# Patient Record
Sex: Male | Born: 1990 | Race: White | Hispanic: No | Marital: Married | Smoking: Current every day smoker
Health system: Southern US, Community
[De-identification: ages and names within clinical notes are randomized; demographics above are authoritative.]

## PROBLEM LIST (undated history)

## (undated) DIAGNOSIS — J45909 Unspecified asthma, uncomplicated: Secondary | ICD-10-CM

## (undated) HISTORY — PX: APPENDECTOMY: SHX54

---
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
## 2014-10-09 ENCOUNTER — Emergency Department (HOSPITAL_COMMUNITY)
Admission: EM | Admit: 2014-10-09 | Discharge: 2014-10-09 | Disposition: A | Discharge status: Home / Self Care | Payer: Medicaid Other | Attending: Emergency Medicine | Admitting: Emergency Medicine

## 2014-10-09 ENCOUNTER — Encounter (HOSPITAL_COMMUNITY): Payer: Self-pay | Admitting: *Deleted

## 2014-10-09 ENCOUNTER — Emergency Department (HOSPITAL_COMMUNITY): Payer: Medicaid Other

## 2014-10-09 DIAGNOSIS — F419 Anxiety disorder, unspecified: Secondary | ICD-10-CM | POA: Diagnosis not present

## 2014-10-09 DIAGNOSIS — Z7982 Long term (current) use of aspirin: Secondary | ICD-10-CM | POA: Diagnosis not present

## 2014-10-09 DIAGNOSIS — R197 Diarrhea, unspecified: Secondary | ICD-10-CM | POA: Diagnosis not present

## 2014-10-09 DIAGNOSIS — R079 Chest pain, unspecified: Secondary | ICD-10-CM | POA: Insufficient documentation

## 2014-10-09 DIAGNOSIS — J45901 Unspecified asthma with (acute) exacerbation: Secondary | ICD-10-CM | POA: Insufficient documentation

## 2014-10-09 DIAGNOSIS — Z720 Tobacco use: Secondary | ICD-10-CM | POA: Insufficient documentation

## 2014-10-09 HISTORY — DX: Unspecified asthma, uncomplicated: J45.909

## 2014-10-09 LAB — I-STAT TROPONIN, ED
TROPONIN I, POC: 0 ng/mL (ref 0.00–0.08)
Troponin i, poc: 0 ng/mL (ref 0.00–0.08)

## 2014-10-09 LAB — CBC
HCT: 39.6 % (ref 39.0–52.0)
HEMOGLOBIN: 13.7 g/dL (ref 13.0–17.0)
MCH: 30.6 pg (ref 26.0–34.0)
MCHC: 34.6 g/dL (ref 30.0–36.0)
MCV: 88.4 fL (ref 78.0–100.0)
PLATELETS: 266 10*3/uL (ref 150–400)
RBC: 4.48 MIL/uL (ref 4.22–5.81)
RDW: 12.6 % (ref 11.5–15.5)
WBC: 5 10*3/uL (ref 4.0–10.5)

## 2014-10-09 LAB — BASIC METABOLIC PANEL
Anion gap: 12 (ref 5–15)
BUN: 17 mg/dL (ref 6–20)
CALCIUM: 9.6 mg/dL (ref 8.9–10.3)
CHLORIDE: 103 mmol/L (ref 101–111)
CO2: 21 mmol/L — AB (ref 22–32)
Creatinine, Ser: 0.88 mg/dL (ref 0.61–1.24)
GFR calc non Af Amer: >60 mL/min (ref >60)
Glucose, Bld: 91 mg/dL (ref 65–99)
Potassium: 3.9 mmol/L (ref 3.5–5.1)
Sodium: 136 mmol/L (ref 135–145)

## 2014-10-09 MED ORDER — ALBUTEROL SULFATE (2.5 MG/3ML) 0.083% IN NEBU
2.5000 mg | INHALATION_SOLUTION | Freq: Once | RESPIRATORY_TRACT | Status: AC
  Administered 2014-10-09: 2.5 mg via RESPIRATORY_TRACT
  Filled 2014-10-09: qty 3

## 2014-10-09 MED ORDER — ALBUTEROL SULFATE HFA 108 (90 BASE) MCG/ACT IN AERS
2.0000 | INHALATION_SPRAY | Freq: Once | RESPIRATORY_TRACT | Status: AC
  Administered 2014-10-09: 2 via RESPIRATORY_TRACT
  Filled 2014-10-09: qty 6.7

## 2014-10-09 MED ORDER — ASPIRIN 81 MG PO CHEW
324.0000 mg | CHEWABLE_TABLET | Freq: Once | ORAL | Status: AC
  Administered 2014-10-09: 324 mg via ORAL
  Filled 2014-10-09: qty 4

## 2014-10-09 NOTE — ED Notes (Signed)
Pt reports mid chest heaviness this am with mild sob. Reports recent high stress levels and weight loss, no appetite. ekg done. Airway intact.

## 2014-10-09 NOTE — ED Provider Notes (Signed)
CSN: [id number]     Arrival date & time 10/09/14  4098 History  This chart was scribed for Catha Gosselin, PA-C, working with Margarita Grizzle, MD by Chestine Spore, ED Scribe. The patient was seen in room B18C/B18C at 9:0o AM.     Chief Complaint  Patient presents with  . Chest Pain      The history is provided by the patient. No language interpreter was used.    HPI Comments: Derrick Collins is a 24 y.o. male with a medical hx of asthma who presents to the Emergency Department complaining of non-radiating left sided CP onset 7:30 AM PTA. Pt reports that his CP didn't wake him up from his sleep. Pt rates his CP as 8/10 currently. Pt does do marijuana to aid in increasing his appetite. 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 Pt notes that he has lost 18 lbs in the past month and 5 lbs in the past 3 days due to lack of appetite and he initially weighed 135 lbs. He states that he is having associated symptoms of mild SOB, weight loss, appetite change, chills, loose stool x 2 days without blood. Pt notes that he will intermittently have palpitations in the past. He denies n/v, blood in stool, abdominal pain, dysuria, hematuria, and any other symptoms. Pt does smoke but he smokes less than 0.5 PPD. He denies any alcohol use.    Past Medical History  Diagnosis Date  . Asthma    History reviewed. No pertinent past surgical history. History reviewed. No pertinent family history. Social History  Substance Use Topics  . Smoking status: Current Every Day Smoker    Types: Cigarettes  . Smokeless tobacco: None  . Alcohol Use: No    Review of Systems  Constitutional: Positive for appetite change and unexpected weight change.  Respiratory: Positive for shortness of breath (mild).   Cardiovascular: Positive for chest pain.  Gastrointestinal: Positive for diarrhea (loose stool). Negative for nausea, vomiting, abdominal pain and blood in stool.  Genitourinary: Negative for dysuria and hematuria.   All other systems reviewed and are negative.     Allergies  Sulfa antibiotics  Home Medications   Prior to Admission medications   Medication Sig Start Date End Date Taking? Authorizing Provider  aspirin 81 MG chewable tablet Chew 81 mg by mouth daily as needed for mild pain or moderate pain.   Yes Historical Provider, MD   BP 119/76 mmHg  Pulse 81  Temp(Src) 98.2 F (36.8 C) (Oral)  Resp 13  Ht 5\' 11"  (1.803 m)  Wt 117 lb 14.4 oz (53.479 kg)  BMI 16.45 kg/m2  SpO2 99% Physical Exam  Constitutional: He is oriented to person, place, and time. He appears well-developed and well-nourished. No distress.  HENT:  Head: Normocephalic and atraumatic.  Eyes: EOM are normal.  Neck: Neck supple. No tracheal deviation present.  Cardiovascular: Normal rate, regular rhythm and normal heart sounds.  Exam reveals no gallop and no friction rub.   No murmur heard. Pulmonary/Chest: Effort normal and breath sounds normal. No accessory muscle usage. No respiratory distress. He has no decreased breath sounds. He has no wheezes. He has no rales. He exhibits no tenderness.  Abdominal: Soft. He exhibits no distension. There is no tenderness. There is no rebound and no guarding.  Musculoskeletal: Normal range of motion.  Neurological: He is alert and oriented to person, place, and time.  Skin: Skin is warm and dry.  Psychiatric: His behavior is normal. His  mood appears anxious.  Nursing note and vitals reviewed.   ED Course  Procedures (including critical care time) DIAGNOSTIC STUDIES: Oxygen Saturation is 99% on RA, nl by my interpretation.    COORDINATION OF CARE: 9:16 AM Discussed treatment plan with pt at bedside and pt agreed to plan.   Labs Review Labs Reviewed  BASIC METABOLIC PANEL - Abnormal; Notable for the following:    CO2 21 (*)    All other components within normal limits  CBC  I-STAT TROPOININ, ED  Rosezena Sensor, ED    Imaging Review Dg Chest 2 View  10/09/2014    CLINICAL DATA:  24 year old male with left-sided chest pain, shortness breath and weakness this morning.  EXAM: CHEST  2 VIEW  COMPARISON:  No priors.  FINDINGS: Lung volumes are normal. No consolidative airspace disease. No pleural effusions. No pneumothorax. No pulmonary nodule or mass noted. Pulmonary vasculature and the cardiomediastinal silhouette are within normal limits.  IMPRESSION: No radiographic evidence of acute cardiopulmonary disease.   Electronically Signed   By: Trudie Reed M.D.   On: 10/09/2014 09:17   I, Patel-Mills, Lorelle Formosa, personally reviewed and evaluated these images and lab results as part of my medical decision-making.    EKG Interpretation   Date/Time:  Sunday October 09 2014 08:25:47 EDT Ventricular Rate:  99 PR Interval:  144 QRS Duration: 100 QT Interval:  358 QTC Calculation: 459 R Axis:   98 Text Interpretation:  Normal sinus rhythm Rightward axis No previous  tracing Confirmed by Denton Lank  MD, Caryn Bee ([id number]) on 10/09/2014 9:25:04 AM      MDM   Final diagnoses:  Chest pain, unspecified chest pain type  Patient presents for chest pain with shortness of breath and chest pain. He was given aspirin as well as a neb treatment. He is well-appearing and in no acute distress. Vitals are stable and have been throughout his stay. Troponin is negative. Labs unremarkable. EKG normal. Chest x-ray shows no pneumothorax, edema, pneumonia. Recheck: Patient states his chest pain has come down. Repeat troponin negative. I discussed return precautions with the patient. Patient was given albuterol inhaler in the ED before discharge. Medications  aspirin chewable tablet 324 mg (324 mg Oral Given 10/09/14 0927)  albuterol (PROVENTIL) (2.5 MG/3ML) 0.083% nebulizer solution 2.5 mg (2.5 mg Nebulization Given 10/09/14 1015)  albuterol (PROVENTIL HFA;VENTOLIN HFA) 108 (90 BASE) MCG/ACT inhaler 2 puff (2 puffs Inhalation Given 10/09/14 1319)  I personally performed the services described  in this documentation, which was scribed in my presence. The recorded information has been reviewed and is accurate.   Catha Gosselin, PA-C 10/09/14 1444  Margarita Grizzle, MD 10/09/14 1623

## 2014-10-09 NOTE — ED Notes (Signed)
Tried to track down PA. Unable to find at this time.

## 2014-10-09 NOTE — Discharge Instructions (Signed)
Chest Pain (Nonspecific) °It is often hard to give a diagnosis for the cause of chest pain. There is always a chance that your pain could be related to something serious, such as a heart attack or a blood clot in the lungs. You need to follow up with your doctor. °HOME CARE °· If antibiotic medicine was given, take it as directed by your doctor. Finish the medicine even if you start to feel better. °· For the next few days, avoid activities that bring on chest pain. Continue physical activities as told by your doctor. °· Do not use any tobacco products. This includes cigarettes, chewing tobacco, and e-cigarettes. °· Avoid drinking alcohol. °· Only take medicine as told by your doctor. °· Follow your doctor's suggestions for more testing if your chest pain does not go away. °· Keep all doctor visits you made. °GET HELP IF: °· Your chest pain does not go away, even after treatment. °· You have a rash with blisters on your chest. °· You have a fever. °GET HELP RIGHT AWAY IF:  °· You have more pain or pain that spreads to your arm, neck, jaw, back, or belly (abdomen). °· You have shortness of breath. °· You cough more than usual or cough up blood. °· You have very bad back or belly pain. °· You feel sick to your stomach (nauseous) or throw up (vomit). °· You have very bad weakness. °· You pass out (faint). °· You have chills. °This is an emergency. Do not wait to see if the problems will go away. Call your local emergency services (911 in [place] Do not drive yourself to the hospital. °MAKE SURE YOU:  °· Understand these instructions. °· Will watch your condition. °· Will get help right away if you are not doing well or get worse. °Document Released: 07/31/2007 Document Revised: 02/16/2013 Document Reviewed: 07/31/2007 °ExitCare® Patient Information ©2015 [employer]. This information is not intended to replace advice given to you by your health care provider. Make sure you discuss any questions you have with your  health care provider. ° ° °Emergency Department Resource Guide °1) Find a Doctor and Pay Out of Pocket °Although you won't have to find out who is covered by your insurance plan, it is a good idea to ask around and get recommendations. You will then need to call the office and see if the doctor you have chosen will accept you as a new patient and what types of options they offer for patients who are self-pay. Some doctors offer discounts or will set up payment plans for their patients who do not have insurance, but you will need to ask so you aren't surprised when you get to your appointment. ° °2) Contact Your Local Health Department °Not all health departments have doctors that can see patients for sick visits, but many do, so it is worth a call to see if yours does. If you don't know where your local health department is, you can check in your phone book. The CDC also has a tool to help you locate your state's health department, and many state websites also have listings of all of their local health departments. ° °3) Find a Walk-in Clinic °If your illness is not likely to be very severe or complicated, you may want to try a walk in clinic. These are popping up all over the country in pharmacies, drugstores, and shopping centers. They're usually staffed by nurse practitioners or physician assistants that have been trained to treat common   illnesses and complaints. They're usually fairly quick and inexpensive. However, if you have serious medical issues or chronic medical problems, these are probably not your best option. ° °No Primary Care Doctor: °- Call [facility] at  832-8000 - they can help you locate a primary care doctor that  accepts your insurance, provides certain services, etc. °- Physician Referral Service- 1-800-533-3463 ° °Chronic Pain Problems: °Organization         Address  Phone   Notes  °[facility]  (336) 297-2271 Patients need to be referred by their primary care doctor.   ° °Medication Assistance: °Organization         Address  Phone   Notes  °[place] [facility] [address] °[place], [place] [address] (336) 641-8030 --Must be a resident of [place] °-- Must have NO insurance coverage whatsoever (no Medicaid/ Medicare, etc.) °-- The pt. MUST have a primary care doctor that directs their care regularly and follows them in the community °  °[facility]  (866) 331-1348   °[employer]  (888) 892-1162   ° °Agencies that provide inexpensive medical care: °Organization         Address  Phone   Notes  °[facility]  (336) 832-8035   °[facility]    (336) 832-7272   °[facility] [address] °[place], [place] [address] (336) 832-4777   °[facility] [address], °[place] (336) 271-4999   °[facility]    (336) 373-0678   °[facility]    (336) 272-1050   °[facility] ° [address], [place] Phone:  (336) 832-4444, Fax:  (336) 832-4440 Hours of Operation:  9 am - 6 pm, M-F.  Also accepts Medicaid/Medicare and self-pay.  °[facility] ° [address], [place] Phone: (336) 832-3150, Fax: (336) 832-3151. Hours of Operation:  8:30 am - 5:30 pm, M-F.  Also accepts Medicaid and self-pay.  °[facility] [place] [address], [place] Phone: (336) 878-6027   °[facility] [address], [place], [place] (336)723-1848, Ext. 123 Mondays & Thursdays: 7-9 AM.  First 15 patients are seen on a first come, first serve basis. °  ° °Medicaid-accepting [place] Providers: ° °Organization         Address  Phone   Notes  °[facility] [address], [place] (336) 641-2100 Also accepts self-pay patients.  °[facility] [address], [place] ° (336) 856-9996   °[facility] [address], [place] (336) 288-8857   °[facility] [address] [place] [address], [place] (336) 299-7000   °Veita Bland [address], [place]  ° (336) 373-1557 Only accepts [place] Access Medicaid patients after they have their name applied to their card.  ° °Self-Pay (no insurance) in [place]: ° °Organization         Address  Phone   Notes  °Sickle Cell Patients, [facility] [address], [place] (336) 832-1970   °[facility] [address], [place] (336) 832-4400   °[facility] ° [address], [place] (336) 992-4800   °[facility]/Dr. Osei-Bonsu ° [address] [place] [address],  or [address],   [place] (336) 841-8500 Phone number for both [place] and [place] locations is the same.  °[facility] [address], [place] (336) 299-0000   °[facility] [address] [place] [address], [place] or [address] (336) 852-7530 °(336) 878-2260   °[facility] [address], [place] (336) 350-1642, phone; (336) 294-5005, fax Sees patients 1st and 3rd Saturday of every month.  Must not qualify for public or private insurance (i.e. Medicaid, Medicare, [place] Health Choice, Veterans' Benefits) • Household income should be no more than 200% of the poverty level •The clinic cannot treat you if you are pregnant or think you are pregnant • Sexually transmitted diseases are not treated at the clinic.  ° ° °Dental Care: °Organization         Address  Phone  Notes  °[facility] [address], [place] (336) 641-6152 Accepts children up to age 21 who are enrolled in Medicaid or [place] Health Choice; pregnant women with a Medicaid card; and children who have applied for Medicaid or [place] Health Choice, but were declined, whose parents can pay a reduced fee at time of service.    °[facility] [place]  [address], [place] (336) 641-7733 Accepts children up to age 21 who are enrolled in Medicaid or [place] Health Choice; pregnant women with a Medicaid card; and children who have applied for Medicaid or [place] Health Choice, but were declined, whose parents can pay a reduced fee at time of service.  °[facility] ° [address], [place] (336) 641-4533 Patients are seen by appointment only. Walk-ins are not accepted. [facility] will see patients 18 years of age and older. °Monday - Tuesday (8am-5pm) °Most Wednesdays (8:30-5pm) °$30 per visit, cash only  °[facility] ° [address], [place] (336) 641-4533 Patients are seen by appointment only. Walk-ins are not accepted. [facility] will see patients 18 years of age and older. °One Wednesday Evening (Monthly: Volunteer Based).  $30 per visit, cash only  °[facility]  (919) 537-3737 for adults; Children under age 4, call Graduate Pediatric Dentistry at (919) [phone]. Children aged 4-14, please call (919) [phone] to request a pediatric application. ° Dental services are provided in all areas of dental care including fillings, crowns and bridges, complete and partial dentures, implants, gum treatment, root canals, and extractions. Preventive care is also provided. Treatment is provided to both adults and children. °Patients are selected via a lottery and there is often a waiting list. °  °[facility] [address], ° ° (336) 763-8833 [email or web address] °  °[facility] [address], [place], [place] (336)723-1848, Ext. 123 Second and Fourth Thursday of each month, opens at 6:30 AM; Clinic ends at 9 AM.  Patients are seen on a first-come first-served basis, and a limited number are seen during each clinic.  ° °[facility] ° [address], [place], [place] (336)  723-7904   Eligibility Requirements °You must have lived in [place], [place], or [place] counties for at least the last three months. °  You cannot be eligible for state or federal sponsored healthcare insurance, including Veterans Administration, Medicaid, or Medicare. °  You generally cannot be eligible for healthcare insurance through your employer.  °  How to apply: °Eligibility screenings are held every Tuesday   and Wednesday afternoon from 1:00 pm until 4:00 pm. You do not need an appointment for the interview!  °[facility] [address], [place], [place] 336-631-2330   °[facility]  336-342-8273   °[facility]  336-703-3100   °[facility]  336-570-6415   ° °Behavioral Health Resources in the Community: °Intensive Outpatient Programs °Organization         Address  Phone  Notes  °[facility] [address], [place], [place] 336-878-6098   °[facility] [address], [place], [place] 336-832-9800   °[facility] [address], [place], [place] ° 336-882-2125   °[facility] [address],  °[place], [place] 1-800-853-5163 or 336-641-4981   °Substance Abuse Resources °Organization         Address  Phone  Notes  °[facility]  336-882-2125   °[facility]  336-784-9470   °[facility]  336-285-9073   °[facility]  336-845-3988   °Residential & Outpatient Substance Abuse Program  1-800-659-3381   °Psychological Services °Organization         Address  Phone  Notes  °[facility]  336- 832-9600   °[facility]  336- 378-7881   °[facility] [address], [place] 1-800-853-5163 or 336-641-4981   ° °Mobile Crisis Teams °Organization         Address  Phone  Notes  °[facility], Mobile Crisis Care Unit  1-877-626-1772   °Assertive °[facility] ° [address] [place], [place] 336-834-9664   °Sharon DeEsch [address] °[place] 336-554-5454   ° °Self-Help/Support Groups °Organization         Address  Phone             Notes  °[facility] - variety of support groups  336- 373-1402 Call for more information  °Narcotics Anonymous (NA), [facility] [address], °[place]  2 meetings at this location  ° °Residential Treatment Programs °Organization         Address  Phone  Notes  °[facility] [address],    °[place]  1-866-801-8205   °[facility] ° [address], [place], [place] 704-293-8524   °[facility] [address], [place] 336-845-3988 Admissions: 8am-3pm M-F  °[facility] [address]    °[place], [place] 336-841-1104   °[facility] [address], [place], [place] 336-379-7146   °[facility] [address]  °[place], [place] 336-285-9073   °[facility] - Intensive Outpatient [address], [place], [place] 336-852-3033   °[facility] [address]  °[place], [place] 1-877-615-2722 or 336-784-9470   °[facility] [address] [place], [place] 336-227-7417 Accepts Medicaid  °[facility] [address]  °[place] 1-800-659-3381 Substance Abuse/Addiction Treatment  ° °[place] Behavioral Health Resources °Organization         Address  Phone  Notes  °[facility]  (888) 581-9988   °Julie Brannon, PhD [address] [place], [place]   (336) 349-5553 or (336) 951-0000   °[facility]   [address] °[place], [place] (336) 349-4454   °[facility] [address], [place],  (336) 342-8316 Insurance/Medicaid/sponsorship through [facility]  °[facility] [address]                                    [place], [place] (336) 342-8316  Therapy/tele-psych/case  °[facility] [address]  ° [place], [place] (336) 349-2233    °Dr. Arfeen  (336) 349-4544   °[facility] [place]  [employer] [place] [facility] 1) [address], [place] °2) [address], [place] °3)  [address], [place] (336) 349-3220 °(336) 342-7768 ° °(336) 342-8140   °[place] [facility] (336) 342-1394 or (336) 342-3537 (After Hours)    ° ° ° °

## 2014-12-12 ENCOUNTER — Encounter (HOSPITAL_COMMUNITY): Payer: Self-pay | Admitting: Oncology

## 2014-12-12 ENCOUNTER — Emergency Department (HOSPITAL_COMMUNITY)
Admission: EM | Admit: 2014-12-12 | Discharge: 2014-12-12 | Disposition: A | Discharge status: Home / Self Care | Payer: Medicaid Other | Attending: Emergency Medicine | Admitting: Emergency Medicine

## 2014-12-12 DIAGNOSIS — K029 Dental caries, unspecified: Secondary | ICD-10-CM | POA: Diagnosis not present

## 2014-12-12 DIAGNOSIS — Z7982 Long term (current) use of aspirin: Secondary | ICD-10-CM | POA: Diagnosis not present

## 2014-12-12 DIAGNOSIS — Z720 Tobacco use: Secondary | ICD-10-CM | POA: Insufficient documentation

## 2014-12-12 DIAGNOSIS — J45909 Unspecified asthma, uncomplicated: Secondary | ICD-10-CM | POA: Insufficient documentation

## 2014-12-12 DIAGNOSIS — K0889 Other specified disorders of teeth and supporting structures: Secondary | ICD-10-CM | POA: Diagnosis present

## 2014-12-12 MED ORDER — NAPROXEN 375 MG PO TABS: 375.0000 mg | ORAL_TABLET | Freq: Two times a day (BID) | ORAL | Status: DC

## 2014-12-12 MED ORDER — PENICILLIN V POTASSIUM 500 MG PO TABS
500.0000 mg | ORAL_TABLET | Freq: Once | ORAL | Status: AC
  Administered 2014-12-12: 500 mg via ORAL
  Filled 2014-12-12: qty 1

## 2014-12-12 MED ORDER — NAPROXEN 375 MG PO TABS
375.0000 mg | ORAL_TABLET | Freq: Once | ORAL | Status: AC
Start: 2014-12-12 — End: 2014-12-12
  Administered 2014-12-12: 375 mg via ORAL
  Filled 2014-12-12: qty 1

## 2014-12-12 MED ORDER — PENICILLIN V POTASSIUM 500 MG PO TABS: 500.0000 mg | ORAL_TABLET | Freq: Four times a day (QID) | ORAL | Status: AC

## 2014-12-12 NOTE — ED Provider Notes (Signed)
CSN: [id number]     Arrival date & time 12/12/14  0242 History   First MD Initiated Contact with Patient 12/12/14 0354     Chief Complaint  Patient presents with  . Dental Pain     (Consider location/radiation/quality/duration/timing/severity/associated sxs/prior Treatment) Patient is a 24 y.o. male presenting with tooth pain. The history is provided by the patient.  Dental Pain Location:  Upper Upper teeth location:  3/RU 1st molar Quality:  Dull Severity:  Severe Onset quality:  Gradual Timing:  Constant Progression:  Unchanged Chronicity:  New Context: dental caries   Previous work-up:  Dental exam Relieved by:  Nothing Worsened by:  Nothing tried Ineffective treatments:  None tried Associated symptoms: no congestion, no drooling, no facial swelling and no trismus   Risk factors: no diabetes     Past Medical History  Diagnosis Date  . Asthma    History reviewed. No pertinent past surgical history. History reviewed. No pertinent family history. Social History  Substance Use Topics  . Smoking status: Current Every Day Smoker    Types: Cigarettes  . Smokeless tobacco: Never Used  . Alcohol Use: No    Review of Systems  HENT: Positive for dental problem. Negative for congestion, drooling and facial swelling.   All other systems reviewed and are negative.     Allergies  Sulfa antibiotics  Home Medications   Prior to Admission medications   Medication Sig Start Date End Date Taking? Authorizing Provider  aspirin 81 MG chewable tablet Chew 81 mg by mouth daily as needed for mild pain or moderate pain.    Historical Provider, MD   BP 138/65 mmHg  Pulse 77  Temp(Src) 97.9 F (36.6 C) (Oral)  Resp 20  Ht 5\' 11"  (1.803 m)  Wt 125 lb (56.7 kg)  BMI 17.44 kg/m2  SpO2 99% Physical Exam  Constitutional: He is oriented to person, place, and time. He appears well-developed and well-nourished. No distress.  HENT:  Head: Normocephalic and atraumatic.    Mouth/Throat: Oropharynx is clear and moist. No trismus in the jaw.    Eyes: Conjunctivae and EOM are normal. Pupils are equal, round, and reactive to light.  Neck: Normal range of motion. Neck supple.  Cardiovascular: Normal rate and regular rhythm.   Pulmonary/Chest: Effort normal and breath sounds normal. No stridor. He has no wheezes. He has no rales.  Abdominal: Soft. Bowel sounds are normal. There is no tenderness.  Musculoskeletal: Normal range of motion.  Lymphadenopathy:    He has no cervical adenopathy.  Neurological: He is alert and oriented to person, place, and time.  Skin: Skin is warm and dry.  Psychiatric: He has a normal mood and affect.    ED Course  Procedures (including critical care time) Labs Review Labs Reviewed - No data to display  Imaging Review No results found. I have personally reviewed and evaluated these images and lab results as part of my medical decision-making.   EKG Interpretation None      MDM   Final diagnoses:  None    Penicillin, Naproxen and referral to dentistry for ongoing care.      Cy BlamerApril Taline Nass, MD 12/12/14 [phone]

## 2014-12-12 NOTE — Discharge Instructions (Signed)
Dental Care and Dentist Visits °Dental care supports good overall health. Regular dental visits can also help you avoid dental pain, bleeding, infection, and other more serious health problems in the future. It is important to keep the mouth healthy because diseases in the teeth, gums, and other oral tissues can spread to other areas of the body. Some problems, such as diabetes, heart disease, and pre-term labor have been associated with poor oral health.  °See your dentist every 6 months. If you experience emergency problems such as a toothache or broken tooth, go to the dentist right away. If you see your dentist regularly, you may catch problems early. It is easier to be treated for problems in the early stages.  °WHAT TO EXPECT AT A DENTIST VISIT  °Your dentist will look for many common oral health problems and recommend proper treatment. At your regular dental visit, you can expect: °· Gentle cleaning of the teeth and gums. This includes scraping and polishing. This helps to remove the sticky substance around the teeth and gums (plaque). Plaque forms in the mouth shortly after eating. Over time, plaque hardens on the teeth as tartar. If tartar is not removed regularly, it can cause problems. Cleaning also helps remove stains. °· Periodic X-rays. These pictures of the teeth and supporting bone will help your dentist assess the health of your teeth. °· Periodic fluoride treatments. Fluoride is a natural mineral shown to help strengthen teeth. Fluoride treatment involves applying a fluoride gel or varnish to the teeth. It is most commonly done in children. °· Examination of the mouth, tongue, jaws, teeth, and gums to look for any oral health problems, such as: °¨ Cavities (dental caries). This is decay on the tooth caused by plaque, sugar, and acid in the mouth. It is best to catch a cavity when it is small. °¨ Inflammation of the gums caused by plaque buildup (gingivitis). °¨ Problems with the mouth or malformed  or misaligned teeth. °¨ Oral cancer or other diseases of the soft tissues or jaws.  °KEEP YOUR TEETH AND GUMS HEALTHY °For healthy teeth and gums, follow these general guidelines as well as your dentist's specific advice: °· Have your teeth professionally cleaned at the dentist every 6 months. °· Brush twice daily with a fluoride toothpaste. °· Floss your teeth daily.  °· Ask your dentist if you need fluoride supplements, treatments, or fluoride toothpaste. °· Eat a healthy diet. Reduce foods and drinks with added sugar. °· Avoid smoking. °TREATMENT FOR ORAL HEALTH PROBLEMS °If you have oral health problems, treatment varies depending on the conditions present in your teeth and gums. °· Your caregiver will most likely recommend good oral hygiene at each visit. °· For cavities, gingivitis, or other oral health disease, your caregiver will perform a procedure to treat the problem. This is typically done at a separate appointment. Sometimes your caregiver will refer you to another dental specialist for specific tooth problems or for surgery. °SEEK IMMEDIATE DENTAL CARE IF: °· You have pain, bleeding, or soreness in the gum, tooth, jaw, or mouth area. °· A permanent tooth becomes loose or separated from the gum socket. °· You experience a blow or injury to the mouth or jaw area. °  °This information is not intended to replace advice given to you by your health care provider. Make sure you discuss any questions you have with your health care provider. °  °Document Released: 10/24/2010 Document Revised: 05/06/2011 Document Reviewed: 10/24/2010 °Elsevier Interactive Patient Education ©2016 [employer] ° °

## 2014-12-12 NOTE — ED Notes (Signed)
Pt presents d/t right upper dental pain x 1 week.  Pt has taken OTC pain medication w/o relief.  Pt states that he cannot get into the dentist for 2 more weeks.

## 2016-04-08 ENCOUNTER — Ambulatory Visit (HOSPITAL_COMMUNITY)
Admission: EM | Admit: 2016-04-08 | Discharge: 2016-04-08 | Disposition: A | Discharge status: Home / Self Care | Payer: Medicaid Other | Attending: Family Medicine | Admitting: Family Medicine

## 2016-04-08 ENCOUNTER — Encounter (HOSPITAL_COMMUNITY): Payer: Self-pay | Admitting: Emergency Medicine

## 2016-04-08 DIAGNOSIS — R112 Nausea with vomiting, unspecified: Secondary | ICD-10-CM

## 2016-04-08 DIAGNOSIS — R197 Diarrhea, unspecified: Secondary | ICD-10-CM

## 2016-04-08 MED ORDER — ONDANSETRON 4 MG PO TBDP
ORAL_TABLET | ORAL | Status: AC
  Filled 2016-04-08: qty 1

## 2016-04-08 MED ORDER — ONDANSETRON HCL 4 MG PO TABS: 4.0000 mg | ORAL_TABLET | Freq: Four times a day (QID) | ORAL | 0 refills | Status: DC

## 2016-04-08 MED ORDER — SODIUM CHLORIDE 0.9 % IV BOLUS (SEPSIS)
1000.0000 mL | Freq: Once | INTRAVENOUS | Status: AC
  Administered 2016-04-08: 1000 mL via INTRAVENOUS

## 2016-04-08 MED ORDER — ONDANSETRON 4 MG PO TBDP
4.0000 mg | ORAL_TABLET | Freq: Once | ORAL | Status: AC
  Administered 2016-04-08: 4 mg via ORAL

## 2016-04-08 NOTE — Discharge Instructions (Signed)
Clear liquid , bland diet tonight as tolerated, advance on fri as improved, use medicine as needed, return or see your doctor if any problems.  °

## 2016-04-08 NOTE — ED Triage Notes (Signed)
[facility] a complaint of abdominal cramping with N/V/D and a sore throat that started yesterday. The patient denied any fever at home.

## 2016-04-08 NOTE — ED Provider Notes (Signed)
[facility]    CSN: [id number] Arrival date & time: 04/08/16  1353     History   Chief Complaint Chief Complaint  Patient presents with  . Abdominal Cramping    HPI Derrick Collins is a 26 y.o. male.   The history is provided by the patient.  Abdominal Cramping  This is a new problem. The current episode started yesterday (wife similarly sick but resolviing). The problem has been gradually improving. Associated symptoms include abdominal pain. The symptoms are aggravated by eating.    Past Medical History:  Diagnosis Date  . Asthma     There are no active problems to display for this patient.   History reviewed. No pertinent surgical history.     Home Medications    Prior to Admission medications   Medication Sig Start Date End Date Taking? Authorizing Provider  ondansetron (ZOFRAN) 4 MG tablet Take 1 tablet (4 mg total) by mouth every 6 (six) hours. Prn n/v. 04/08/16   Linna HoffJames D Rudolf Blizard, MD    Family History History reviewed. No pertinent family history.  Social History Social History  Substance Use Topics  . Smoking status: Current Every Day Smoker    Types: Cigarettes  . Smokeless tobacco: Never Used  . Alcohol use No     Allergies   Sulfa antibiotics   Review of Systems Review of Systems  Constitutional: Positive for activity change, appetite change and fever.  HENT: Negative.   Respiratory: Negative.   Cardiovascular: Negative.   Gastrointestinal: Positive for abdominal pain, diarrhea, nausea and vomiting. Negative for blood in stool and constipation.  Genitourinary: Negative.   Musculoskeletal: Negative.      Physical Exam Triage Vital Signs ED Triage Vitals [04/08/16 1554]  Enc Vitals Group     BP 105/66     Pulse Rate 81     Resp 18     Temp 98.2 F (36.8 C)     Temp Source Oral     SpO2 98 %     Weight      Height      Head Circumference      Peak Flow      Pain Score 9     Pain Loc      Pain Edu?      Excl. in  GC?    No data found.   Updated Vital Signs BP 105/66 (BP Location: Right Arm)   Pulse 81   Temp 98.2 F (36.8 C) (Oral)   Resp 18   SpO2 98%   Visual Acuity Right Eye Distance:   Left Eye Distance:   Bilateral Distance:    Right Eye Near:   Left Eye Near:    Bilateral Near:     Physical Exam  Constitutional: He is oriented to person, place, and time. He appears distressed.  Neck: Normal range of motion. Neck supple.  Pulmonary/Chest: Effort normal and breath sounds normal.  Abdominal: Soft. Bowel sounds are normal. He exhibits no distension and no mass. There is tenderness. There is no rebound and no guarding. No hernia.  Neurological: He is alert and oriented to person, place, and time.  Skin: Skin is warm and dry.  Nursing note and vitals reviewed.    UC Treatments / Results  Labs (all labs ordered are listed, but only abnormal results are displayed) Labs Reviewed - No data to display  EKG  EKG Interpretation None       Radiology No results found.  Procedures Procedures (  including critical care time)  Medications Ordered in UC Medications  sodium chloride 0.9 % bolus 1,000 mL (1,000 mLs Intravenous Given 04/08/16 1741)  ondansetron (ZOFRAN-ODT) disintegrating tablet 4 mg (4 mg Oral Given 04/08/16 1741)     Initial Impression / Assessment and Plan / UC Course  I have reviewed the triage vital signs and the nursing notes.  Pertinent labs & imaging results that were available during my care of the patient were reviewed by me and considered in my medical decision making (see chart for details).    Sx improved after ivf.   Final Clinical Impressions(s) / UC Diagnoses   Final diagnoses:  Nausea vomiting and diarrhea    New Prescriptions New Prescriptions   ONDANSETRON (ZOFRAN) 4 MG TABLET    Take 1 tablet (4 mg total) by mouth every 6 (six) hours. Prn Ewell Poe, MD 04/08/16 [phone]

## 2016-06-25 ENCOUNTER — Ambulatory Visit (HOSPITAL_COMMUNITY)
Admission: EM | Admit: 2016-06-25 | Discharge: 2016-06-25 | Disposition: A | Discharge status: Home / Self Care | Payer: Self-pay | Attending: Family Medicine | Admitting: Family Medicine

## 2016-06-25 ENCOUNTER — Encounter (HOSPITAL_COMMUNITY): Payer: Self-pay | Admitting: Family Medicine

## 2016-06-25 DIAGNOSIS — G44209 Tension-type headache, unspecified, not intractable: Secondary | ICD-10-CM

## 2016-06-25 MED ORDER — CYCLOBENZAPRINE HCL 10 MG PO TABS: 10.0000 mg | ORAL_TABLET | Freq: Two times a day (BID) | ORAL | 0 refills | Status: AC | PRN

## 2016-06-25 MED ORDER — NAPROXEN 500 MG PO TABS: 500.0000 mg | ORAL_TABLET | Freq: Two times a day (BID) | ORAL | 0 refills | Status: DC

## 2016-06-25 NOTE — ED Provider Notes (Signed)
CSN: [id number]     Arrival date & time 06/25/16  1219 History   First MD Initiated Contact with Patient 06/25/16 1238     Chief Complaint  Patient presents with  . Headache   (Consider location/radiation/quality/duration/timing/severity/associated sxs/prior Treatment) Patient c/o headache since 3 am this morning.  He states the headache is wrapped around his head and is severe.   The history is provided by the patient.  Headache  Pain location:  Generalized Quality:  Dull Radiates to:  R neck Severity currently:  8/10 Severity at highest:  8/10 Onset quality:  Sudden Duration:  6 hours Timing:  Constant Chronicity:  New Similar to prior headaches: no   Context: activity and bright light   Relieved by:  Nothing Worsened by:  Nothing Associated symptoms: fatigue     Past Medical History:  Diagnosis Date  . Asthma    History reviewed. No pertinent surgical history. History reviewed. No pertinent family history. Social History  Substance Use Topics  . Smoking status: Current Every Day Smoker    Types: Cigarettes  . Smokeless tobacco: Never Used  . Alcohol use No    Review of Systems  Constitutional: Positive for fatigue.  HENT: Negative.   Eyes: Negative.   Respiratory: Negative.   Cardiovascular: Negative.   Gastrointestinal: Negative.   Endocrine: Negative.   Genitourinary: Negative.   Musculoskeletal: Negative.   Neurological: Positive for headaches.  Hematological: Negative.   Psychiatric/Behavioral: Negative.     Allergies  Sulfa antibiotics  Home Medications   Prior to Admission medications   Medication Sig Start Date End Date Taking? Authorizing Provider  cyclobenzaprine (FLEXERIL) 10 MG tablet Take 1 tablet (10 mg total) by mouth 2 (two) times daily as needed for muscle spasms. 06/25/16   Deatra Canter, FNP  naproxen (NAPROSYN) 500 MG tablet Take 1 tablet (500 mg total) by mouth 2 (two) times daily with a meal. 06/25/16   Deatra Canter, FNP   ondansetron (ZOFRAN) 4 MG tablet Take 1 tablet (4 mg total) by mouth every 6 (six) hours. Prn n/v. 04/08/16   Linna Hoff, MD   Meds Ordered and Administered this Visit  Medications - No data to display  BP 117/68   Pulse 83   Temp 98.6 F (37 C)   Resp 18   SpO2 98%  No data found.   Physical Exam  Constitutional: He is oriented to person, place, and time. He appears well-developed and well-nourished.  HENT:  Head: Normocephalic and atraumatic.  Eyes: Conjunctivae and EOM are normal. Pupils are equal, round, and reactive to light.  Neck: Normal range of motion. Neck supple.  Cardiovascular: Normal rate, regular rhythm and normal heart sounds.   Pulmonary/Chest: Effort normal and breath sounds normal.  Abdominal: Soft. Bowel sounds are normal.  Musculoskeletal: Normal range of motion. He exhibits tenderness.  TTP right cervical paraspinous muscles  Neurological: He is alert and oriented to person, place, and time.  Nursing note and vitals reviewed.   Urgent Care Course     Procedures (including critical care time)  Labs Review Labs Reviewed - No data to display  Imaging Review No results found.   Visual Acuity Review  Right Eye Distance:   Left Eye Distance:   Bilateral Distance:    Right Eye Near:   Left Eye Near:    Bilateral Near:         MDM   1. Tension headache    Naprosyn  one po bid x 7  days #14 Flexeril  one po bid prn #14      Deatra Canter, FNP 06/25/16 1319

## 2016-06-25 NOTE — ED Triage Notes (Signed)
Pt here for headache that started around 3 this am. sts that he has taken tylenol, aleve and ibuprofen without relief.

## 2016-07-03 ENCOUNTER — Emergency Department (HOSPITAL_COMMUNITY)
Admission: EM | Admit: 2016-07-03 | Discharge: 2016-07-04 | Disposition: A | Discharge status: Home / Self Care | Payer: Self-pay | Attending: Emergency Medicine | Admitting: Emergency Medicine

## 2016-07-03 ENCOUNTER — Encounter (HOSPITAL_COMMUNITY): Payer: Self-pay

## 2016-07-03 DIAGNOSIS — Z79899 Other long term (current) drug therapy: Secondary | ICD-10-CM | POA: Insufficient documentation

## 2016-07-03 DIAGNOSIS — J45909 Unspecified asthma, uncomplicated: Secondary | ICD-10-CM | POA: Insufficient documentation

## 2016-07-03 DIAGNOSIS — J0100 Acute maxillary sinusitis, unspecified: Secondary | ICD-10-CM | POA: Insufficient documentation

## 2016-07-03 DIAGNOSIS — W57XXXA Bitten or stung by nonvenomous insect and other nonvenomous arthropods, initial encounter: Secondary | ICD-10-CM

## 2016-07-03 DIAGNOSIS — F17210 Nicotine dependence, cigarettes, uncomplicated: Secondary | ICD-10-CM | POA: Insufficient documentation

## 2016-07-03 LAB — CBC WITH DIFFERENTIAL/PLATELET
BASOS ABS: 0.1 10*3/uL (ref 0.0–0.1)
Basophils Relative: 1 %
EOS ABS: 0.5 10*3/uL (ref 0.0–0.7)
EOS PCT: 5 %
HCT: 38 % — ABNORMAL LOW (ref 39.0–52.0)
Hemoglobin: 12.7 g/dL — ABNORMAL LOW (ref 13.0–17.0)
Lymphocytes Relative: 27 %
Lymphs Abs: 2.7 10*3/uL (ref 0.7–4.0)
MCH: 30.2 pg (ref 26.0–34.0)
MCHC: 33.4 g/dL (ref 30.0–36.0)
MCV: 90.3 fL (ref 78.0–100.0)
MONO ABS: 0.6 10*3/uL (ref 0.1–1.0)
Monocytes Relative: 6 %
Neutro Abs: 6.1 10*3/uL (ref 1.7–7.7)
Neutrophils Relative %: 61 %
Platelets: 231 10*3/uL (ref 150–400)
RBC: 4.21 MIL/uL — AB (ref 4.22–5.81)
RDW: 13 % (ref 11.5–15.5)
WBC: 9.9 10*3/uL (ref 4.0–10.5)

## 2016-07-03 LAB — COMPREHENSIVE METABOLIC PANEL
ALBUMIN: 4.5 g/dL (ref 3.5–5.0)
ALK PHOS: 44 U/L (ref 38–126)
ALT: 13 U/L — ABNORMAL LOW (ref 17–63)
AST: 19 U/L (ref 15–41)
Anion gap: 8 (ref 5–15)
BILIRUBIN TOTAL: 0.2 mg/dL — AB (ref 0.3–1.2)
BUN: 20 mg/dL (ref 6–20)
CO2: 26 mmol/L (ref 22–32)
Calcium: 9.2 mg/dL (ref 8.9–10.3)
Chloride: 104 mmol/L (ref 101–111)
Creatinine, Ser: 0.84 mg/dL (ref 0.61–1.24)
GFR calc Af Amer: >60 mL/min (ref >60)
GLUCOSE: 95 mg/dL (ref 65–99)
POTASSIUM: 3.9 mmol/L (ref 3.5–5.1)
Sodium: 138 mmol/L (ref 135–145)
TOTAL PROTEIN: 6.7 g/dL (ref 6.5–8.1)

## 2016-07-03 NOTE — ED Triage Notes (Signed)
Pt endorses having 2 ticks stuck on him 4 days and then he started having flu like sx yesterday. Pt endorses chills, headache, generalized pain, vomiting and states "I just want to sleep all the time" VSS. Afebrile in triage.

## 2016-07-03 NOTE — ED Provider Notes (Signed)
[facility]   CSN: [id number] Arrival date & time: 07/03/16  2143     History   Chief Complaint Chief Complaint  Patient presents with  . Tick Removal  . Influenza    HPI Derrick Collins is a 26 y.o. male.  HPI  26 y.o. male with a hx of Asthma, presents to the Emergency Department today due to flu like symptoms. Notes emesis, chills, headaches, generalized body aches x 1-2 days. No diarrhea. No CP/SOB. Reports sinus congestion and bilateral ear pressure. Noted rhinorrhea. Of note, pt had 2 ticks stuck on him for 4 days ago. Removed right away with heads intact. No rashes around tick bite or around body. No fevers. No other symptoms noted.   Past Medical History:  Diagnosis Date  . Asthma     There are no active problems to display for this patient.   History reviewed. No pertinent surgical history.     Home Medications    Prior to Admission medications   Medication Sig Start Date End Date Taking? Authorizing Provider  cyclobenzaprine (FLEXERIL) 10 MG tablet Take 1 tablet (10 mg total) by mouth 2 (two) times daily as needed for muscle spasms. 06/25/16   Deatra Canter, FNP  naproxen (NAPROSYN) 500 MG tablet Take 1 tablet (500 mg total) by mouth 2 (two) times daily with a meal. 06/25/16   [place], Anselm Pancoast, FNP  ondansetron (ZOFRAN) 4 MG tablet Take 1 tablet (4 mg total) by mouth every 6 (six) hours. Prn n/v. 04/08/16   Linna Hoff, MD    Family History History reviewed. No pertinent family history.  Social History Social History  Substance Use Topics  . Smoking status: Current Every Day Smoker    Types: Cigarettes  . Smokeless tobacco: Never Used  . Alcohol use No     Allergies   Sulfa antibiotics   Review of Systems Review of Systems ROS reviewed and all are negative for acute change except as noted in the HPI.  Physical Exam Updated Vital Signs BP 107/71 (BP Location: Left Arm)   Pulse 89   Temp 98.1 F (36.7 C) (Oral)    Resp 16   Ht 5\' 11"  (1.803 m)   Wt 59 kg   SpO2 98%   BMI 18.13 kg/m   Physical Exam  Constitutional: He is oriented to person, place, and time. Vital signs are normal. He appears well-developed and well-nourished. No distress.  HENT:  Head: Normocephalic and atraumatic.  Right Ear: Hearing, tympanic membrane, external ear and ear canal normal.  Left Ear: Hearing, tympanic membrane, external ear and ear canal normal.  Nose: Right sinus exhibits frontal sinus tenderness. Left sinus exhibits frontal sinus tenderness.  Mouth/Throat: Uvula is midline, oropharynx is clear and moist and mucous membranes are normal. No trismus in the jaw. No oropharyngeal exudate, posterior oropharyngeal erythema or tonsillar abscesses.  Eyes: Conjunctivae and EOM are normal. Pupils are equal, round, and reactive to light.  Neck: Normal range of motion. Neck supple. No tracheal deviation present.  Cardiovascular: Normal rate, regular rhythm, S1 normal, S2 normal, normal heart sounds, intact distal pulses and normal pulses.   Pulmonary/Chest: Effort normal and breath sounds normal. No respiratory distress. He has no decreased breath sounds. He has no wheezes. He has no rhonchi. He has no rales.  Abdominal: Normal appearance and bowel sounds are normal. There is no tenderness.  Musculoskeletal: Normal range of motion.  Neurological: He is alert and oriented to person, place, and  time.  Skin: Skin is warm and dry.  No rashes noted. Tick bites on lower leg and genitals appear well healed. No evidence of erythema migrans.  Psychiatric: He has a normal mood and affect. His speech is normal and behavior is normal. Thought content normal.     ED Treatments / Results  Labs (all labs ordered are listed, but only abnormal results are displayed) Labs Reviewed  COMPREHENSIVE METABOLIC PANEL - Abnormal; Notable for the following:       Result Value   ALT 13 (*)    Total Bilirubin 0.2 (*)    All other components within  normal limits  CBC WITH DIFFERENTIAL/PLATELET - Abnormal; Notable for the following:    RBC 4.21 (*)    Hemoglobin 12.7 (*)    HCT 38.0 (*)    All other components within normal limits    EKG  EKG Interpretation None       Radiology No results found.  Procedures Procedures (including critical care time)  Medications Ordered in ED Medications - No data to display   Initial Impression / Assessment and Plan / ED Course  I have reviewed the triage vital signs and the nursing notes.  Pertinent labs & imaging results that were available during my care of the patient were reviewed by me and considered in my medical decision making (see chart for details).  Final Clinical Impressions(s) / ED Diagnoses  {I have reviewed and evaluated the relevant laboratory values.   {I have reviewed the relevant previous healthcare records.  {I obtained HPI from historian.   ED Course:  Assessment: Pt is a 26 y.o. male with hx Asthma who presents with flu-like symptoms x 1-2 days. Chills, body aches, sinus pressure and ear pressure. Noted tick bite x 4 days ago and both ticks removed with head intact. No rashes since then. No fevers. On exam, pt in NAD. Nontoxic/nonseptic appearing. VSS. Afebrile. Lungs CTA. Heart RRR. Abdomen nontender soft. Labs unremarkable. Likely sinusitis. Will give Flonase, but will also cover for possible lyme with Doxy and give full treatment, which should treat sinusitis as well. [facility]. Plan is to DC home with ABX and strict return precautions. Pt agreeable to plan. At time of discharge, Patient is in no acute distress. Vital Signs are stable. Patient is able to ambulate. Patient able to tolerate PO.   Disposition/Plan:  DC Home Additional Verbal discharge instructions given and discussed with patient.  Pt Instructed to f/u with PCP in the next week for evaluation and treatment of symptoms. Return precautions given Pt acknowledges and  agrees with plan  Supervising Physician Derwood KaplanNanavati, Ankit, MD  Final diagnoses:  Acute non-recurrent maxillary sinusitis  Tick bite, initial encounter    New Prescriptions New Prescriptions   No medications on file       Audry PiliMohr, Yocheved Depner, Cordelia Poche-C 07/04/16 0019    Derwood KaplanNanavati, Ankit, MD 07/04/16 [phone]

## 2016-07-04 MED ORDER — FLUTICASONE PROPIONATE 50 MCG/ACT NA SUSP: 1.0000 | Freq: Every day | NASAL | 0 refills | Status: AC

## 2016-07-04 MED ORDER — DOXYCYCLINE HYCLATE 100 MG PO TABS
100.0000 mg | ORAL_TABLET | Freq: Once | ORAL | Status: AC
  Administered 2016-07-04: 100 mg via ORAL
  Filled 2016-07-04: qty 1

## 2016-07-04 MED ORDER — DOXYCYCLINE HYCLATE 100 MG PO CAPS: 100.0000 mg | ORAL_CAPSULE | Freq: Two times a day (BID) | ORAL | 0 refills | Status: AC

## 2016-07-04 NOTE — Discharge Instructions (Signed)
Please read and follow all provided instructions.  Your diagnoses today include:  1. Acute non-recurrent maxillary sinusitis   2. Tick bite, initial encounter     Tests performed today include: Vital signs. See below for your results today.   Medications prescribed:  Take as prescribed   Home care instructions:  Follow any educational materials contained in this packet.  Follow-up instructions: Please follow-up with your primary care provider for further evaluation of symptoms and treatment   Return instructions:  Please return to the Emergency Department if you do not get better, if you get worse, or new symptoms OR  - Fever (temperature greater than 101.62F)  - Bleeding that does not stop with holding pressure to the area    -Severe pain (please note that you may be more sore the day after your accident)  - Chest Pain  - Difficulty breathing  - Severe nausea or vomiting  - Inability to tolerate food and liquids  - Passing out  - Skin becoming red around your wounds  - Change in mental status (confusion or lethargy)  - New numbness or weakness    Please return if you have any other emergent concerns.  Additional Information:  Your vital signs today were: BP 107/71 (BP Location: Left Arm)    Pulse 89    Temp 98.1 F (36.7 C) (Oral)    Resp 16    Ht 5\' 11"  (1.803 m)    Wt 59 kg    SpO2 98%    BMI 18.13 kg/m  If your blood pressure (BP) was elevated above 135/85 this visit, please have this repeated by your doctor within one month. ---------------

## 2016-07-05 LAB — ROCKY MTN SPOTTED FVR ABS PNL(IGG+IGM)
RMSF IgG: NEGATIVE
RMSF IgM: 0.45 index (ref 0.00–0.89)

## 2016-07-05 LAB — B. BURGDORFI ANTIBODIES: B burgdorferi Ab IgG+IgM: <0.91 {ISR} (ref 0.00–0.90)

## 2016-11-16 ENCOUNTER — Encounter (HOSPITAL_COMMUNITY): Payer: Self-pay | Admitting: Emergency Medicine

## 2016-11-16 DIAGNOSIS — F17210 Nicotine dependence, cigarettes, uncomplicated: Secondary | ICD-10-CM | POA: Insufficient documentation

## 2016-11-16 DIAGNOSIS — R1011 Right upper quadrant pain: Secondary | ICD-10-CM | POA: Insufficient documentation

## 2016-11-16 DIAGNOSIS — J45909 Unspecified asthma, uncomplicated: Secondary | ICD-10-CM | POA: Insufficient documentation

## 2016-11-16 DIAGNOSIS — R112 Nausea with vomiting, unspecified: Secondary | ICD-10-CM | POA: Insufficient documentation

## 2016-11-16 LAB — CBC
HCT: 38.2 % — ABNORMAL LOW (ref 39.0–52.0)
HEMOGLOBIN: 12.9 g/dL — AB (ref 13.0–17.0)
MCH: 30.7 pg (ref 26.0–34.0)
MCHC: 33.8 g/dL (ref 30.0–36.0)
MCV: 91 fL (ref 78.0–100.0)
PLATELETS: 255 10*3/uL (ref 150–400)
RBC: 4.2 MIL/uL — AB (ref 4.22–5.81)
RDW: 13 % (ref 11.5–15.5)
WBC: 5 10*3/uL (ref 4.0–10.5)

## 2016-11-16 LAB — COMPREHENSIVE METABOLIC PANEL
ALK PHOS: 49 U/L (ref 38–126)
ALT: 20 U/L (ref 17–63)
AST: 27 U/L (ref 15–41)
Albumin: 4.3 g/dL (ref 3.5–5.0)
Anion gap: 7 (ref 5–15)
BILIRUBIN TOTAL: 0.3 mg/dL (ref 0.3–1.2)
BUN: 11 mg/dL (ref 6–20)
CALCIUM: 9.2 mg/dL (ref 8.9–10.3)
CO2: 29 mmol/L (ref 22–32)
CREATININE: 0.92 mg/dL (ref 0.61–1.24)
Chloride: 101 mmol/L (ref 101–111)
Glucose, Bld: 85 mg/dL (ref 65–99)
Potassium: 3.8 mmol/L (ref 3.5–5.1)
Sodium: 137 mmol/L (ref 135–145)
Total Protein: 6.8 g/dL (ref 6.5–8.1)

## 2016-11-16 LAB — URINALYSIS, ROUTINE W REFLEX MICROSCOPIC
BILIRUBIN URINE: NEGATIVE
Bacteria, UA: NONE SEEN
Glucose, UA: NEGATIVE mg/dL
Ketones, ur: NEGATIVE mg/dL
LEUKOCYTES UA: NEGATIVE
Nitrite: NEGATIVE
PH: 5 (ref 5.0–8.0)
Protein, ur: 30 mg/dL — AB
SPECIFIC GRAVITY, URINE: 1.029 (ref 1.005–1.030)
Squamous Epithelial / LPF: NONE SEEN

## 2016-11-16 LAB — LIPASE, BLOOD: LIPASE: 25 U/L (ref 11–51)

## 2016-11-16 NOTE — ED Triage Notes (Signed)
Pt c/o abdominal pain, mainly in the RUQ, worse after eating. Endorses nausea, denies vomiting/diarrhea.

## 2016-11-17 ENCOUNTER — Emergency Department (HOSPITAL_COMMUNITY)
Admission: EM | Admit: 2016-11-17 | Discharge: 2016-11-17 | Disposition: A | Discharge status: Home / Self Care | Payer: Self-pay | Attending: Emergency Medicine | Admitting: Emergency Medicine

## 2016-11-17 ENCOUNTER — Emergency Department (HOSPITAL_COMMUNITY): Payer: Self-pay

## 2016-11-17 DIAGNOSIS — R112 Nausea with vomiting, unspecified: Secondary | ICD-10-CM

## 2016-11-17 DIAGNOSIS — R1011 Right upper quadrant pain: Secondary | ICD-10-CM

## 2016-11-17 MED ORDER — FAMOTIDINE IN NACL 20-0.9 MG/50ML-% IV SOLN
20.0000 mg | Freq: Once | INTRAVENOUS | Status: AC
  Administered 2016-11-17: 20 mg via INTRAVENOUS
  Filled 2016-11-17: qty 50

## 2016-11-17 MED ORDER — KETOROLAC TROMETHAMINE 30 MG/ML IJ SOLN
30.0000 mg | Freq: Once | INTRAMUSCULAR | Status: AC
  Administered 2016-11-17: 30 mg via INTRAVENOUS
  Filled 2016-11-17: qty 1

## 2016-11-17 MED ORDER — GI COCKTAIL ~~LOC~~
30.0000 mL | Freq: Once | ORAL | Status: AC
  Administered 2016-11-17: 30 mL via ORAL
  Filled 2016-11-17: qty 30

## 2016-11-17 MED ORDER — OMEPRAZOLE 20 MG PO CPDR: 20.0000 mg | DELAYED_RELEASE_CAPSULE | Freq: Every day | ORAL | 0 refills | Status: AC

## 2016-11-17 MED ORDER — METOCLOPRAMIDE HCL 5 MG/ML IJ SOLN
10.0000 mg | Freq: Once | INTRAMUSCULAR | Status: AC
  Administered 2016-11-17: 10 mg via INTRAVENOUS
  Filled 2016-11-17: qty 2

## 2016-11-17 MED ORDER — ALUMINUM-MAGNESIUM-SIMETHICONE 200-200-20 MG/5ML PO SUSP: 15.0000 mL | Freq: Three times a day (TID) | ORAL | 0 refills | Status: AC

## 2016-11-17 MED ORDER — PROMETHAZINE HCL 25 MG PO TABS: 25.0000 mg | ORAL_TABLET | Freq: Four times a day (QID) | ORAL | 0 refills | Status: AC | PRN

## 2016-11-17 NOTE — ED Provider Notes (Signed)
[facility]-EMERGENCY DEPT Provider Note   CSN: [id number] Arrival date & time: 11/16/16  2027     History   Chief Complaint Chief Complaint  Patient presents with  . Abdominal Pain    HPI Derrick Collins is a 26 y.o. male.  26 year old male with a history of appendectomy presents to the emergency department for right upper quadrant abdominal pain. Pain has been intermittent and waxing and waning in severity. It is aggravated with eating and associated with nausea and vomiting. Patient has not had any vomiting for over 24 hours. He reports a low-grade temperature of 99.70F yesterday. Patient further noting a regular bowel movement with looser stool. Unknown melena or hematochezia. No reported sick contacts. He has been taking ibuprofen with minimal relief.      Past Medical History:  Diagnosis Date  . Asthma     There are no active problems to display for this patient.   Past Surgical History:  Procedure Laterality Date  . APPENDECTOMY        Home Medications    Prior to Admission medications   Medication Sig Start Date End Date Taking? Authorizing Provider  aluminum-magnesium hydroxide-simethicone (MAALOX) 200-200-20 MG/5ML SUSP Take 15 mLs by mouth 4 (four) times daily -  before meals and at bedtime. 11/17/16   Antony Madura, PA-C  cyclobenzaprine (FLEXERIL) 10 MG tablet Take 1 tablet (10 mg total) by mouth 2 (two) times daily as needed for muscle spasms. Patient not taking: Reported on 11/17/2016 06/25/16   Deatra Canter, FNP  fluticasone [facility]) 50 MCG/ACT nasal spray Place 1 spray into both nostrils daily. Patient not taking: Reported on 11/17/2016 07/04/16   Audry Pili, PA-C  omeprazole (PRILOSEC) 20 MG capsule Take 1 capsule (20 mg total) by mouth daily. 11/17/16   Antony Madura, PA-C  promethazine (PHENERGAN) 25 MG tablet Take 1 tablet (25 mg total) by mouth every 6 (six) hours as needed for nausea or vomiting. 11/17/16   Antony Madura, PA-C    Family History No family  history on file.  Social History Social History  Substance Use Topics  . Smoking status: Current Every Day Smoker    Types: Cigarettes  . Smokeless tobacco: Never Used  . Alcohol use No     Allergies   Sulfa antibiotics   Review of Systems Review of Systems Ten systems reviewed and are negative for acute change, except as noted in the HPI.    Physical Exam Updated Vital Signs BP 131/81   Pulse 81   Temp 98.1 F (36.7 C) (Oral)   Resp 16   SpO2 100%   Physical Exam  Constitutional: He is oriented to person, place, and time. He appears well-developed and well-nourished. No distress.  Nontoxic appearing and in NAD  HENT:  Head: Normocephalic and atraumatic.  Eyes: Conjunctivae and EOM are normal. No scleral icterus.  Neck: Normal range of motion.  Cardiovascular: Normal rate, regular rhythm and intact distal pulses.   Pulmonary/Chest: Effort normal. No respiratory distress. He has no wheezes.  Lungs CTAB  Abdominal:  Fairly generalized tenderness to palpation EXCEPT in the LUQ. No distension or peritoneal signs.  Musculoskeletal: Normal range of motion.  Neurological: He is alert and oriented to person, place, and time. He exhibits normal muscle tone. Coordination normal.  GCS 15. Patient moving all extremities.  Skin: Skin is warm and dry. No rash noted. He is not diaphoretic. No erythema. No pallor.  Psychiatric: He has a normal mood and affect. His behavior is normal.  Nursing note and vitals reviewed.    ED Treatments / Results  Labs (all labs ordered are listed, but only abnormal results are displayed) Labs Reviewed  CBC - Abnormal; Notable for the following:       Result Value   RBC 4.20 (*)    Hemoglobin 12.9 (*)    HCT 38.2 (*)    All other components within normal limits  URINALYSIS, ROUTINE W REFLEX MICROSCOPIC - Abnormal; Notable for the following:    APPearance HAZY (*)    Hgb urine dipstick SMALL (*)    Protein, ur 30 (*)    All other  components within normal limits  LIPASE, BLOOD  COMPREHENSIVE METABOLIC PANEL    EKG  EKG Interpretation None       Radiology US Abdomen Complete  Result Date: 11/17/2016 CLINICAL DATA:  Abdominal pain for 3 days. EXAM: ABDOMEN ULTRASOUND COMPLETE COMPARISON:  None. FINDINGS: Gallbladder: No gallstones or wall thickening visualized. No sonographic Murphy sign noted by sonographer. Common bile duct: Diameter: 2 mm, normal Liver: No focal lesion identified. Within normal limits in parenchymal echogenicity. Portal vein is patent on color Doppler imaging with normal direction of blood flow towards the liver. IVC: No abnormality visualized. Pancreas: Visualized portion unremarkable. Spleen: Size and appearance within normal limits. Right Kidney: Length: 11.6 cm. Echogenicity within normal limits. No mass or hydronephrosis visualized. Left Kidney: Length: 10.8 cm. Echogenicity within normal limits. No mass or hydronephrosis visualized. Abdominal aorta: No aneurysm visualized. Other findings: None. IMPRESSION: Normal examination.  No acute process identified. Electronically Signed   By: Burman Nieves M.D.   On: 11/17/2016 01:38    Procedures Procedures (including critical care time)  Medications Ordered in ED Medications  ketorolac (TORADOL) 30 MG/ML injection 30 mg (30 mg Intravenous Given 11/17/16 0207)  famotidine (PEPCID) IVPB 20 mg premix (0 mg Intravenous Stopped 11/17/16 0219)  gi cocktail (Maalox,Lidocaine,Donnatal) (30 mLs Oral Given 11/17/16 0208)  metoCLOPramide (REGLAN) injection 10 mg (10 mg Intravenous Given 11/17/16 0207)     Initial Impression / Assessment and Plan / ED Course  I have reviewed the triage vital signs and the nursing notes.  Pertinent labs & imaging results that were available during my care of the patient were reviewed by me and considered in my medical decision making (see chart for details).     26 year old male presents to the emergency department for  right upper quadrant pain which is worse after eating. He has had associated nausea and vomiting, though emesis has subsided over the past 24 hours. Patient with focal right upper quadrant tenderness on exam. Negative Murphy sign. He is afebrile with stable vital signs. Laboratory workup reassuring. Ultrasound obtained to evaluate for biliary colic. Abdominal ultrasound shows no acute process or evidence of cholelithiasis or cholecystitis. Question whether symptoms may be due to viral illness versus gastritis versus PUD. Plan to discharge with instructions for symptomatic management. Return precautions discussed and provided. Patient discharged in stable condition with no unaddressed concerns.   Final Clinical Impressions(s) / ED Diagnoses   Final diagnoses:  Right upper quadrant abdominal pain  Non-intractable vomiting with nausea, unspecified vomiting type    New Prescriptions Discharge Medication List as of 11/17/2016  1:53 AM    START taking these medications   Details  aluminum-magnesium hydroxide-simethicone (MAALOX) 200-200-20 MG/5ML SUSP Take 15 mLs by mouth 4 (four) times daily -  before meals and at bedtime., Starting Sun 11/17/2016, Print    omeprazole (PRILOSEC) 20 MG capsule Take 1 capsule (  20 mg total) by mouth daily., Starting Sun 11/17/2016, Print    promethazine (PHENERGAN) 25 MG tablet Take 1 tablet (25 mg total) by mouth every 6 (six) hours as needed for nausea or vomiting., Starting Sun 11/17/2016, Print         Antony Madura, PA-C 11/17/16 1610    Cathren Laine, MD 11/19/16 1121

## 2017-10-16 ENCOUNTER — Encounter (HOSPITAL_COMMUNITY): Payer: Self-pay | Admitting: Emergency Medicine

## 2017-10-16 ENCOUNTER — Emergency Department (HOSPITAL_COMMUNITY)
Admission: EM | Admit: 2017-10-16 | Discharge: 2017-10-16 | Disposition: A | Discharge status: Home / Self Care | Payer: Self-pay | Attending: Emergency Medicine | Admitting: Emergency Medicine

## 2017-10-16 ENCOUNTER — Other Ambulatory Visit: Payer: Self-pay

## 2017-10-16 DIAGNOSIS — F17210 Nicotine dependence, cigarettes, uncomplicated: Secondary | ICD-10-CM | POA: Insufficient documentation

## 2017-10-16 DIAGNOSIS — Z79899 Other long term (current) drug therapy: Secondary | ICD-10-CM | POA: Insufficient documentation

## 2017-10-16 DIAGNOSIS — J45909 Unspecified asthma, uncomplicated: Secondary | ICD-10-CM | POA: Insufficient documentation

## 2017-10-16 DIAGNOSIS — K0889 Other specified disorders of teeth and supporting structures: Secondary | ICD-10-CM | POA: Insufficient documentation

## 2017-10-16 MED ORDER — CHLORHEXIDINE GLUCONATE 0.12 % MT SOLN: 15.0000 mL | Freq: Two times a day (BID) | OROMUCOSAL | 0 refills | Status: AC

## 2017-10-16 MED ORDER — PENICILLIN V POTASSIUM 500 MG PO TABS: 500.0000 mg | ORAL_TABLET | Freq: Four times a day (QID) | ORAL | 0 refills | Status: AC

## 2017-10-16 NOTE — ED Provider Notes (Signed)
[facility] EMERGENCY DEPARTMENT Provider Note   CSN: [id number] Arrival date & time: 10/16/17  1206     History   Chief Complaint Chief Complaint  Patient presents with  . Dental Pain    HPI Derrick Collins is a 27 y.o. male presenting for 3 months of left lower dental pain.  He states that the pain has been waxing and waning, in severity for the past 3 months.  He denies facial swelling or drainage.  Patient describes the pain as a sharp 5/10 in severity pain that is worsened with chewing on that side.  Patient states that he has taken ibuprofen for his pain without relief.  Patient denies fevers, trouble swallowing, voice change or facial swelling.  HPI  Past Medical History:  Diagnosis Date  . Asthma     There are no active problems to display for this patient.   Past Surgical History:  Procedure Laterality Date  . APPENDECTOMY          Home Medications    Prior to Admission medications   Medication Sig Start Date End Date Taking? Authorizing Provider  aluminum-magnesium hydroxide-simethicone (MAALOX) 200-200-20 MG/5ML SUSP Take 15 mLs by mouth 4 (four) times daily -  before meals and at bedtime. 11/17/16   Antony Madura, PA-C  chlorhexidine (PERIDEX) 0.12 % solution Use as directed 15 mLs in the mouth or throat 2 (two) times daily. 10/16/17   Harlene Salts A, PA-C  cyclobenzaprine (FLEXERIL) 10 MG tablet Take 1 tablet (10 mg total) by mouth 2 (two) times daily as needed for muscle spasms. Patient not taking: Reported on 11/17/2016 06/25/16   Deatra Canter, FNP  fluticasone [facility]) 50 MCG/ACT nasal spray Place 1 spray into both nostrils daily. Patient not taking: Reported on 11/17/2016 07/04/16   Audry Pili, PA-C  omeprazole (PRILOSEC) 20 MG capsule Take 1 capsule (20 mg total) by mouth daily. 11/17/16   Antony Madura, PA-C  penicillin v potassium (VEETID) 500 MG tablet Take 1 tablet (500 mg total) by mouth 4 (four) times daily for 7 days. 10/16/17  10/23/17  Bill Salinas, PA-C  promethazine (PHENERGAN) 25 MG tablet Take 1 tablet (25 mg total) by mouth every 6 (six) hours as needed for nausea or vomiting. 11/17/16   Antony Madura, PA-C    Family History No family history on file.  Social History Social History   Tobacco Use  . Smoking status: Current Every Day Smoker    Types: Cigarettes  . Smokeless tobacco: Never Used  Substance Use Topics  . Alcohol use: No  . Drug use: Yes    Types: Marijuana    Comment: OCC     Allergies   Sulfa antibiotics   Review of Systems Review of Systems  Constitutional: Negative.  Negative for chills, fatigue and fever.  HENT: Positive for dental problem. Negative for drooling, facial swelling, sore throat, trouble swallowing and voice change.   Gastrointestinal: Negative.  Negative for abdominal pain, nausea and vomiting.  Skin: Negative.  Negative for rash.     Physical Exam Updated Vital Signs BP 104/82   Pulse 72   Temp 97.8 F (36.6 C) (Oral)   Resp 16   SpO2 99%   Physical Exam  Constitutional: He is oriented to person, place, and time. He appears well-developed and well-nourished. No distress.  HENT:  Head: Normocephalic and atraumatic.  Right Ear: External ear normal.  Left Ear: External ear normal.  Nose: Nose normal.  Mouth/Throat: Uvula is  midline, oropharynx is clear and moist and mucous membranes are normal. No trismus in the jaw. Dental caries present. No dental abscesses or uvula swelling.    Patient with multiple dental caries present.  Pain to left lower molar worse with percussion of the tooth.  No surrounding gingival erythema or swelling to suggest dental abscess.  No drainage present.  No signs suggestive of peritonsillar abscess. No signs of suggestive of Ludwig's angina. No signs suggestive of retropharyngeal abscess.  Eyes: Pupils are equal, round, and reactive to light. EOM are normal.  Neck: Trachea normal and normal range of motion. No  tracheal deviation present.  Pulmonary/Chest: Effort normal. No respiratory distress.  Abdominal: Soft. There is no tenderness. There is no rebound and no guarding.  Musculoskeletal: Normal range of motion.  Neurological: He is alert and oriented to person, place, and time.  Skin: Skin is warm and dry.  Psychiatric: He has a normal mood and affect. His behavior is normal.     ED Treatments / Results  Labs (all labs ordered are listed, but only abnormal results are displayed) Labs Reviewed - No data to display  EKG None  Radiology No results found.  Procedures Procedures (including critical care time)  Medications Ordered in ED Medications - No data to display   Initial Impression / Assessment and Plan / ED Course  I have reviewed the triage vital signs and the nursing notes.  Pertinent labs & imaging results that were available during my care of the patient were reviewed by me and considered in my medical decision making (see chart for details).    Patient with dental pain. With infected dental surface cavity noted left lower molar, without signs or symptoms of dental abscess, no swelling/erythema/tenderness of the gums.  Patient is well-appearing, afebrile, nontoxic, speaking well.  Patient able to swallow without pain.  No signs of swelling or concern for Ludwig's angina/Peritonsilar abscess/Retropharyngeal abscess or other deep tissue infections.  No sign of swelling of the neck, patient has good range of motion of the neck, no trismus. Will treat with penicillin VK 500 4 times daily for 7 days.  Patient informed to use ibuprofen and Tylenol as directed on packaging for pain.  Patient also given prescription for Peridex mouthwash.  Coupons for antibiotics given by nursing staff.   At this time there does not appear to be any evidence of an acute emergency medical condition and the patient appears stable for discharge with appropriate outpatient follow up. Diagnosis was  discussed with patient who verbalizes understanding of care plan and is agreeable to discharge. I have discussed return precautions with patient who verbalizes understanding of return precautions. Patient strongly encouraged to follow-up with their PCP. All questions answered.    Note: Portions of this report may have been transcribed using voice recognition software. Every effort was made to ensure accuracy; however, inadvertent computerized transcription errors may still be present.    Final Clinical Impressions(s) / ED Diagnoses   Final diagnoses:  Pain, dental    ED Discharge Orders         Ordered    penicillin v potassium (VEETID) 500 MG tablet  4 times daily     10/16/17 1331    chlorhexidine (PERIDEX) 0.12 % solution  2 times daily     10/16/17 1335           Elizabeth PalauMorelli, Yocelyn Brocious A, PA-C 10/16/17 1438    Tegeler, Canary Brimhristopher J, MD 10/16/17 1534

## 2017-10-16 NOTE — Discharge Instructions (Addendum)
Please return to the Emergency Department for any new or worsening symptoms or if your symptoms do not improve. Please be sure to follow up with your Primary Care Physician as soon as possible regarding your visit today. If you do not have a Primary Doctor please use the resources below to establish one. Please follow-up with your dentist as soon as possible regarding your visit today. Please take all of the antibiotic penicillin as prescribed. Also use over-the-counter anti-inflammatories such as Tylenol or ibuprofen as directed on the packaging for pain. You may also use the Peridex mouth rinse as prescribed.  Contact a health care provider if: Your pain is not controlled with medicines. Your symptoms are worse. You have new symptoms. Get help right away if: You are unable to open your mouth. You are having trouble breathing or swallowing. You have a fever. Your face, neck, or jaw is swollen.  RESOURCE GUIDE  Chronic Pain Problems: Contact [facility]  [phone] Patients need to be referred by their primary care doctor.  Insufficient Money for Medicine: Contact United Way:  call "211" or [facility] [phone].  No Primary Care Doctor: Call Health Connect  [phone] - can help you locate a primary care doctor that  accepts your insurance, provides certain services, etc. Physician Referral Service- [phone]  Agencies that provide inexpensive medical care: [facility]  [phone] [facility]  [phone] [facility]  [phone] [facility]  [phone] [facility]  [phone] [facility]  [phone]  Medicaid-accepting [place] Providers: [facility]- [address], [address]  [phone], Mon-Fri 9am-7pm, Sat 9am-1pm [facility]- [address], [address] [place]  [phone] [facility]- [address], [address]  [place]  [phone] [facility]- [address] [place] [address]  [phone] Renaye RakersVeita Bland- [address], [address], [phone]  Only accepts [place] patients after they have their name  applied to their card  Self Pay (no insurance) in [facility]: Sickle Cell Patients: Dr Willey BladeEric Dean, [facility]  [address], [phone] [facility]- [address]  [phone]       Redge Gainer-     [facility] [place]- [address], [address]       -     [facility]- see information above (Speak to Cit[employer]Pam H if you do not have insurance)       -  [facility]- [address], [phone]       -  [facility]- [address],  [phone]       -  [facility]- [address] [place] [address], [phone]       -  Dr Julio Sickssei-Bonsu-  [address], [address], [place], [phone]       -  [facility]- [address], [phone]       -  [facility] [place]- [address] [place] [address], [phone], also [address], [phone]       -    [facility]- [address] [place], [phone], 1st & 3rd Saturday   every month, 10am-1pm  1) Find a Doctor and Pay Out of Pocket Although you won't have to find out who is covered by your insurance plan, it is a good idea to ask around and get recommendations.  You will then need to call the office and see if the doctor you have chosen will accept you as a new patient and what types of options they offer for patients who are self-pay. Some doctors offer discounts or will set up payment plans for their patients who do not have insurance, but you will need to ask so you aren't surprised when you get to your appointment.  2) Contact Your Local Health Department Not all health departments have doctors that can see patients for sick visits, but many do, so it is worth a call to see if yours does. If you don't know where your local health department is, you can check in your  phone book. The CDC also has a tool to help you locate your state's health department, and many state websites also have listings of all of their local health departments.  3) Find a Walk-in Clinic If your illness is not likely to be very severe or complicated, you may want to try a walk in clinic. These are popping up all over the country in pharmacies, drugstores, and shopping centers. They're usually staffed by nurse practitioners or physician assistants that have been trained to treat common illnesses and complaints. They're usually fairly quick and inexpensive. However, if you have serious medical issues or chronic medical problems, these are probably not your best option  STD Testing [facility] Department of [facility] [place], STD Clinic, [address], [place], phone [phone] or [phone].  Monday - Friday, call for an appointment. [facility] Department of [employer], STD Clinic, [address], [place], phone [phone] or [phone].  Monday - Friday, call for an appointment.  Abuse/Neglect: [facility] Child Abuse Hotline [phone] [facility] Child Abuse Hotline [phone] (After Hours)  Emergency Shelter:  [facility] 780-870-6883  Maternity Homes: Room at the [place] [facility] [phone])242-0461 [facility] [phone]  MRSA Hotline #:   (217)274-3059  [facility] [address] [facility] Resources  [facility] [place]  United Way [facility] Dept. [address]                 [address]         [address]  Blondell Reveal Phone:  [phone]                                  Phone:  [phone]                   Phone:  [phone]  [facility], [phone] [phone]       -     [facility] [place], [address]  [phone], [place] [phone] or [phone] (After Hours)   [place]  Substance Abuse Resources: Alcohol and Drug Services  [place] 205-104-6995 The [facility] Chinita Pester 475-752-8524 Residential & Outpatient Substance Abuse Program  [phone]  Psychological Services: [place]  [phone] [place]  [place], [place] [address] [place], [place]: [phone] or [phone], PicCapture.uy  Dental Assistance  If unable to pay or uninsured, contact:  [facility] or [facility]. to become qualified for the adult dental clinic.  Patients with Medicaid: [facility] [phone] [address], [place] [address], [phone]  If unable to pay, or uninsured, contact [facility] [phone]) or [employer] [phone] in [place], [place] in [facility]) to become qualified for the adult dental clinic   Other [place]- [place], [place], [place], [address], [place], [place], 2nd and 4th Thursday of the month at 6:30am.  10 clients each day by appointment, can sometimes see walk-in patients if someone does not show for an appointment. [facility]- [address] Hillard Danker [place], [place], [address], [place], [place], [place], [address], [place] Department- [phone] [facility] [phone]

## 2017-10-16 NOTE — ED Triage Notes (Signed)
Left lower tooth pain x 1 month got worse

## 2019-04-11 IMAGING — US US ABDOMEN COMPLETE
1 series · 14 of 25 positions shown · non-contrast
Comparison: None.

CLINICAL DATA: Abdominal pain for 3 days.

EXAM:
ABDOMEN ULTRASOUND COMPLETE

[Series 1: us abdomen complete · 0.19mm/px · 14 of 85 slices shown]
[im 1/85]
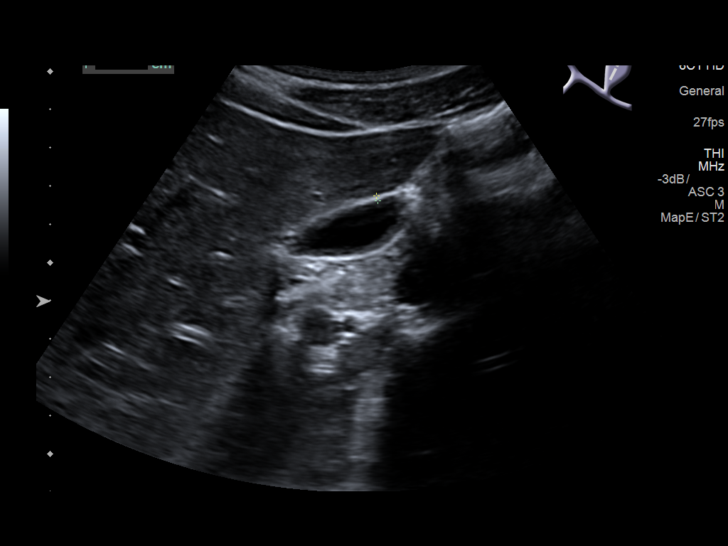
[im 8/85]
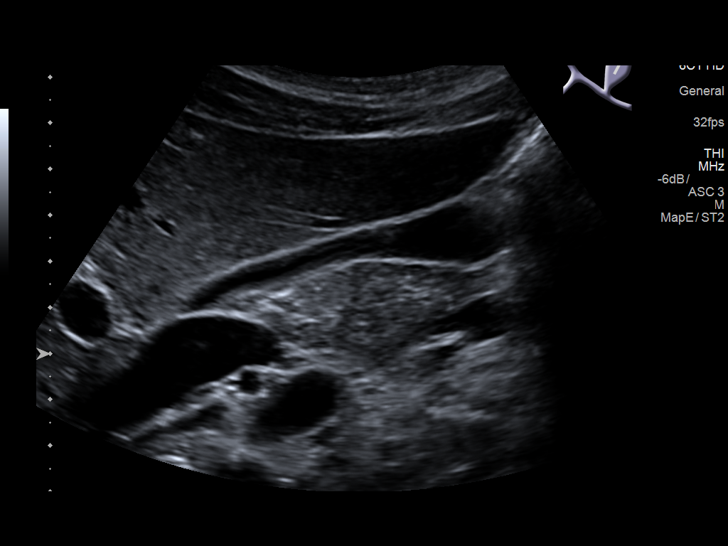
[im 15/85]
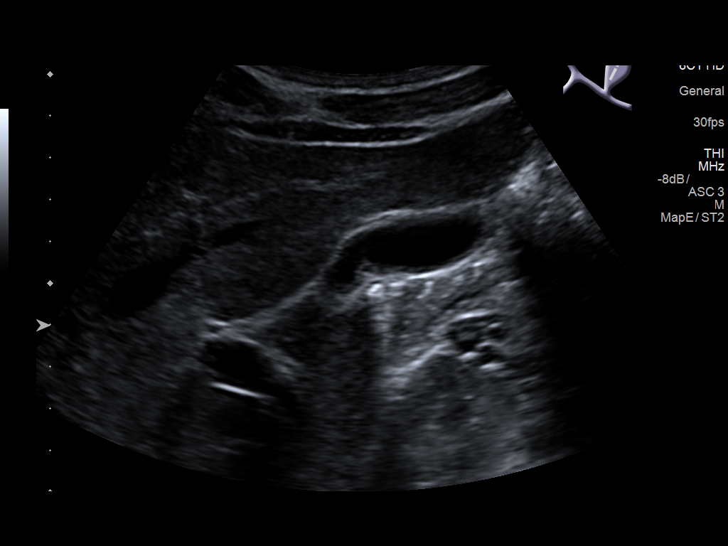
[im 22/85]
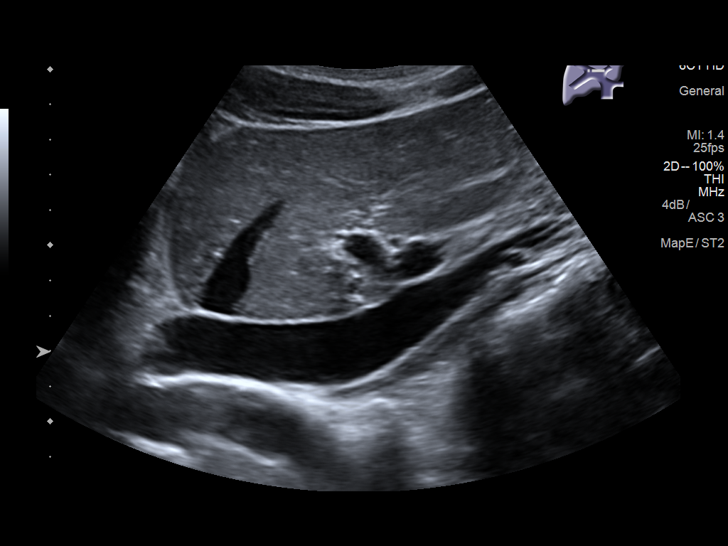
[im 29/85]
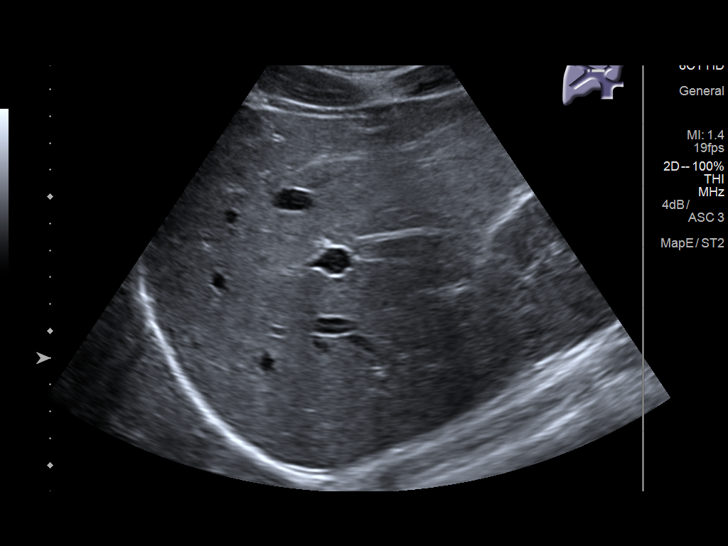
[im 32/85]
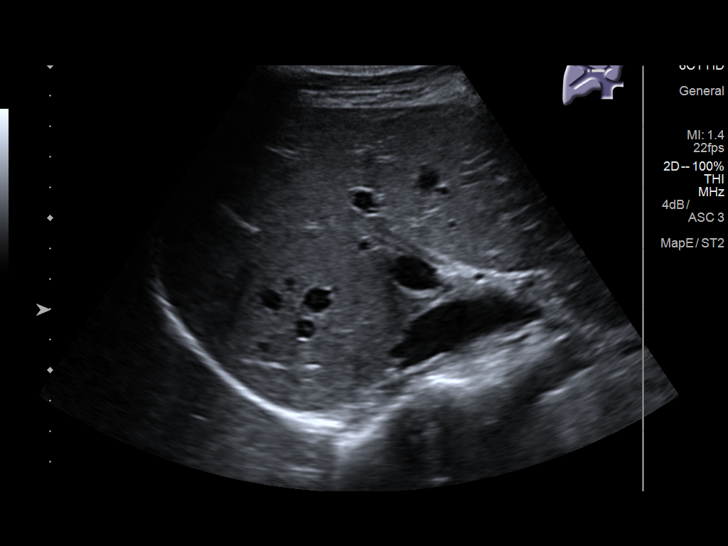
[im 39/85]
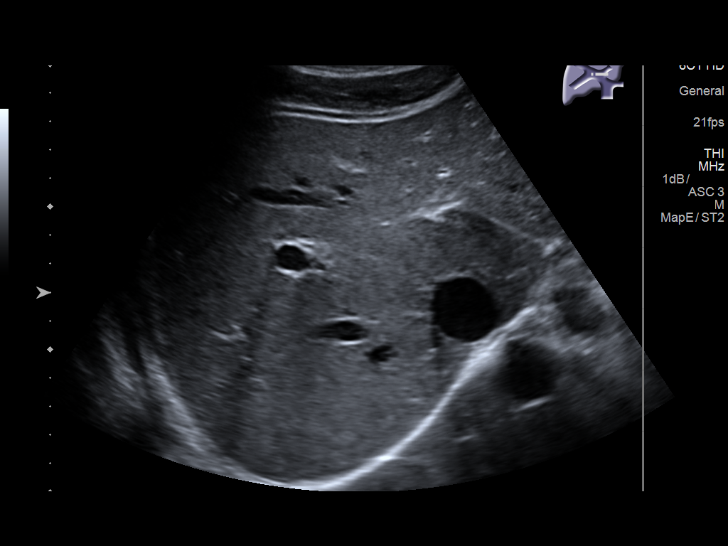
[im 46/85]
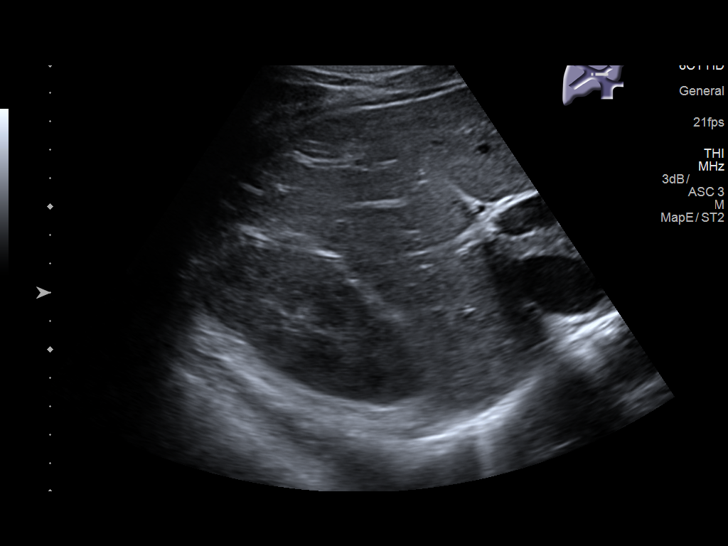
[im 53/85]
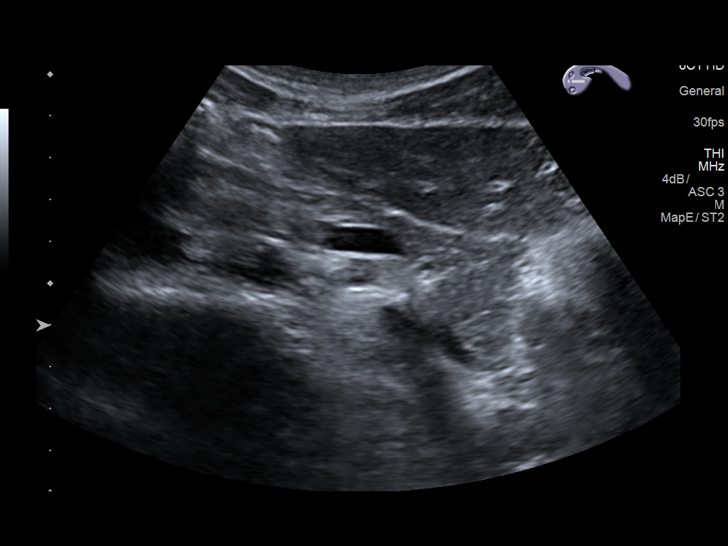
[im 57/85]
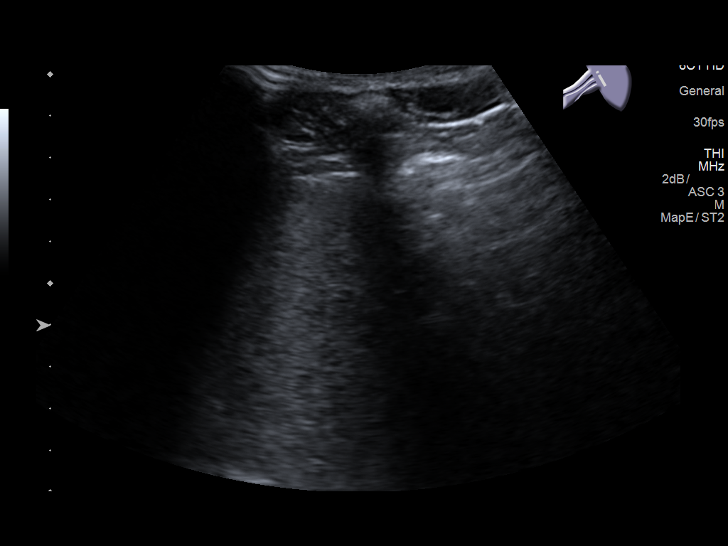
[im 64/85]
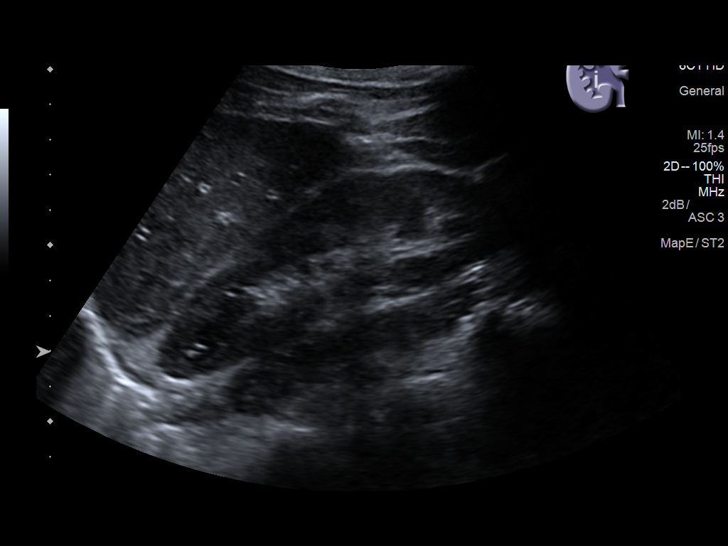
[im 71/85]
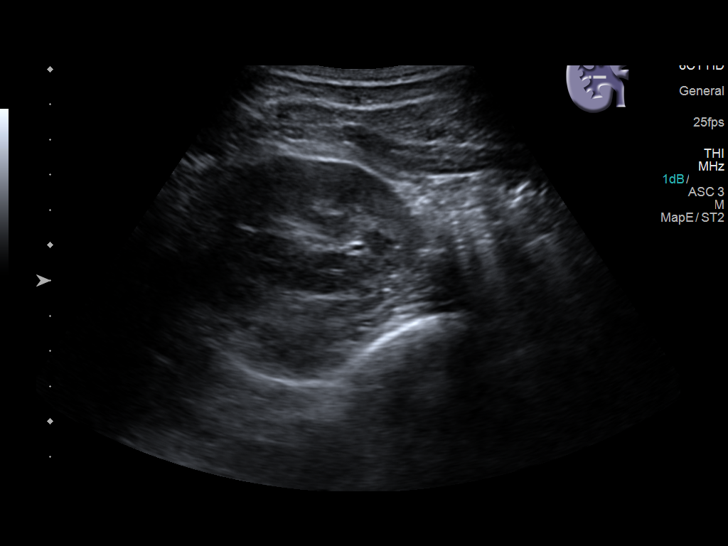
[im 78/85]
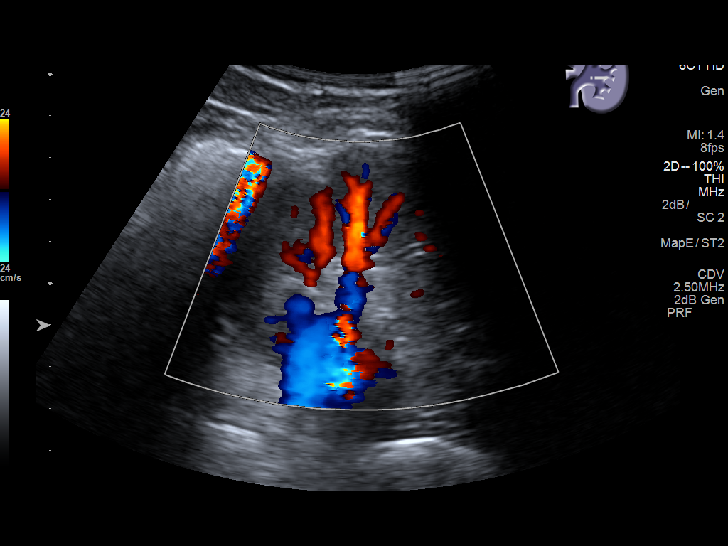
[im 85/85]
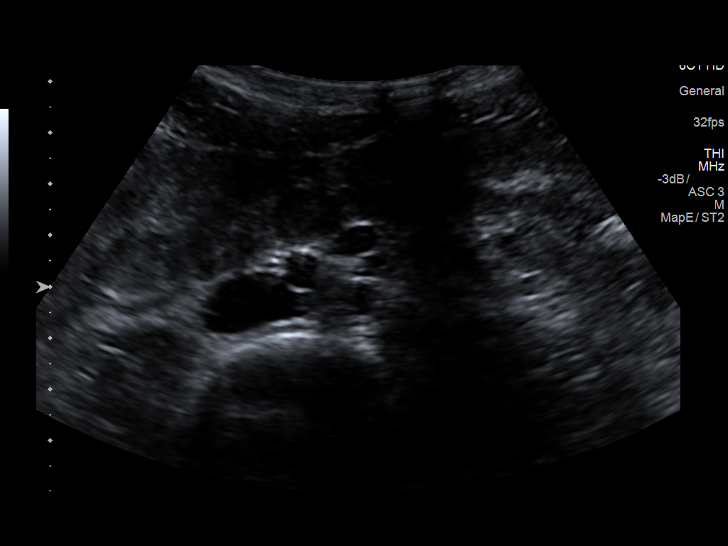

[14 of 25 positions shown; findings below may reference images not displayed]

FINDINGS: Gallbladder: No gallstones or wall thickening visualized. No
sonographic Murphy sign noted by sonographer.

Common bile duct: Diameter: 2 mm, normal

Liver: No focal lesion identified. Within normal limits in
parenchymal echogenicity. Portal vein is patent on color Doppler
imaging with normal direction of blood flow towards the liver.

IVC: No abnormality visualized.

Pancreas: Visualized portion unremarkable.

Spleen: Size and appearance within normal limits.

Right Kidney: Length: 11.6 cm. Echogenicity within normal limits. No
mass or hydronephrosis visualized.

Left Kidney: Length: 10.8 cm. Echogenicity within normal limits. No
mass or hydronephrosis visualized.

Abdominal aorta: No aneurysm visualized.

Other findings: None.
IMPRESSION: Normal examination.  No acute process identified.

## 2019-12-11 ENCOUNTER — Emergency Department: Payer: Self-pay

## 2019-12-11 ENCOUNTER — Emergency Department
Admission: EM | Admit: 2019-12-11 | Discharge: 2019-12-11 | Disposition: A | Discharge status: Home / Self Care | Payer: Self-pay | Attending: Emergency Medicine | Admitting: Emergency Medicine

## 2019-12-11 ENCOUNTER — Other Ambulatory Visit: Payer: Self-pay

## 2019-12-11 DIAGNOSIS — R1011 Right upper quadrant pain: Secondary | ICD-10-CM | POA: Insufficient documentation

## 2019-12-11 DIAGNOSIS — R197 Diarrhea, unspecified: Secondary | ICD-10-CM | POA: Insufficient documentation

## 2019-12-11 DIAGNOSIS — Z79899 Other long term (current) drug therapy: Secondary | ICD-10-CM | POA: Insufficient documentation

## 2019-12-11 DIAGNOSIS — R1010 Upper abdominal pain, unspecified: Secondary | ICD-10-CM

## 2019-12-11 DIAGNOSIS — F17210 Nicotine dependence, cigarettes, uncomplicated: Secondary | ICD-10-CM | POA: Insufficient documentation

## 2019-12-11 DIAGNOSIS — J45909 Unspecified asthma, uncomplicated: Secondary | ICD-10-CM | POA: Insufficient documentation

## 2019-12-11 LAB — COMPREHENSIVE METABOLIC PANEL
ALT: 16 U/L (ref 0–44)
AST: 19 U/L (ref 15–41)
Albumin: 5.1 g/dL — ABNORMAL HIGH (ref 3.5–5.0)
Alkaline Phosphatase: 45 U/L (ref 38–126)
Anion gap: 9 (ref 5–15)
BUN: 21 mg/dL — ABNORMAL HIGH (ref 6–20)
CO2: 28 mmol/L (ref 22–32)
Calcium: 9.5 mg/dL (ref 8.9–10.3)
Chloride: 101 mmol/L (ref 98–111)
Creatinine, Ser: 0.86 mg/dL (ref 0.61–1.24)
GFR, Estimated: >60 mL/min (ref >60)
Glucose, Bld: 107 mg/dL — ABNORMAL HIGH (ref 70–99)
Potassium: 3.8 mmol/L (ref 3.5–5.1)
Sodium: 138 mmol/L (ref 135–145)
Total Bilirubin: 0.7 mg/dL (ref 0.3–1.2)
Total Protein: 7.7 g/dL (ref 6.5–8.1)

## 2019-12-11 LAB — URINALYSIS, COMPLETE (UACMP) WITH MICROSCOPIC
Bacteria, UA: NONE SEEN
Bilirubin Urine: NEGATIVE
Glucose, UA: NEGATIVE mg/dL
Hgb urine dipstick: NEGATIVE
Ketones, ur: NEGATIVE mg/dL
Leukocytes,Ua: NEGATIVE
Nitrite: NEGATIVE
Protein, ur: NEGATIVE mg/dL
Specific Gravity, Urine: 1.02 (ref 1.005–1.030)
pH: 8 (ref 5.0–8.0)

## 2019-12-11 LAB — CBC
HCT: 37.8 % — ABNORMAL LOW (ref 39.0–52.0)
Hemoglobin: 13 g/dL (ref 13.0–17.0)
MCH: 30.7 pg (ref 26.0–34.0)
MCHC: 34.4 g/dL (ref 30.0–36.0)
MCV: 89.4 fL (ref 80.0–100.0)
Platelets: 304 10*3/uL (ref 150–400)
RBC: 4.23 MIL/uL (ref 4.22–5.81)
RDW: 12.9 % (ref 11.5–15.5)
WBC: 6 10*3/uL (ref 4.0–10.5)
nRBC: 0 % (ref 0.0–0.2)

## 2019-12-11 LAB — LIPASE, BLOOD: Lipase: 28 U/L (ref 11–51)

## 2019-12-11 MED ORDER — DICYCLOMINE HCL 10 MG PO CAPS
10.0000 mg | ORAL_CAPSULE | Freq: Once | ORAL | Status: AC
  Administered 2019-12-11: 10 mg via ORAL
  Filled 2019-12-11: qty 1

## 2019-12-11 MED ORDER — SODIUM CHLORIDE 0.9 % IV BOLUS
1000.0000 mL | Freq: Once | INTRAVENOUS | Status: AC
  Administered 2019-12-11: 1000 mL via INTRAVENOUS

## 2019-12-11 MED ORDER — FAMOTIDINE 20 MG PO TABS: 20.0000 mg | ORAL_TABLET | Freq: Two times a day (BID) | ORAL | 0 refills | Status: AC

## 2019-12-11 NOTE — ED Notes (Signed)
Provider at bedside

## 2019-12-11 NOTE — ED Triage Notes (Addendum)
Pt states he has had pain in his mid abdomen since Sunday- pt states sometimes he will get random sharp pains in the RUQ- pt denies n/v but had diarrhea- pt states he has had his appendix removed- states this has happened before about 1 month ago and was told there was nothing wrong with him

## 2019-12-11 NOTE — Discharge Instructions (Signed)
Please take the medication in the morning about 30 minutes before breakfast.  Eat bland foods and stay away from fried or greasy foods.  Follow up with the gastroenterologist. Call to request an appointment.

## 2019-12-11 NOTE — ED Notes (Signed)
Discharge intructions reviewed with pt. Pt AOx4.

## 2019-12-11 NOTE — ED Notes (Signed)
Pt with CT. 

## 2019-12-11 NOTE — ED Provider Notes (Signed)
[facility] Emergency Department Provider Note ____________________________________________   First MD Initiated Contact with Patient 12/11/19 1858     (approximate)  I have reviewed the triage vital signs and the nursing notes.   HISTORY  Chief Complaint Abdominal Pain  HPI Derrick Collins is a 29 y.o. male with history of asthma presents to the emergency department for treatment and evaluation of abdominal pain.  Patient states the pain has been intermittent for the past month.  He was evaluated at a hospital in [facility] but did not get any reason for the pain.  He had a CT scan that he was told was "normal."  He had labs drawn at that time as well which were "normal".  Pain started again almost a week ago and is now constant.  He has intermittent sharp stabbing pains in the right upper quadrant.  No nausea, vomiting, or fever.  He did have a few episodes of diarrhea.  No relief with Tylenol or ibuprofen.         Past Medical History:  Diagnosis Date   Asthma     There are no problems to display for this patient.   Past Surgical History:  Procedure Laterality Date   APPENDECTOMY      Prior to Admission medications   Medication Sig Start Date End Date Taking? Authorizing Provider  aluminum-magnesium hydroxide-simethicone (MAALOX) 200-200-20 MG/5ML SUSP Take 15 mLs by mouth 4 (four) times daily -  before meals and at bedtime. 11/17/16   Antony Madura, PA-C  chlorhexidine (PERIDEX) 0.12 % solution Use as directed 15 mLs in the mouth or throat 2 (two) times daily. 10/16/17   Harlene Salts A, PA-C  cyclobenzaprine (FLEXERIL) 10 MG tablet Take 1 tablet (10 mg total) by mouth 2 (two) times daily as needed for muscle spasms. Patient not taking: Reported on 11/17/2016 06/25/16   Deatra Canter, FNP  famotidine (PEPCID) 20 MG tablet Take 1 tablet (20 mg total) by mouth 2 (two) times daily for 14 days. 12/11/19 12/25/19  Tiffini Blacksher, Kasandra Knudsen, FNP   fluticasone (FLONASE) 50 MCG/ACT nasal spray Place 1 spray into both nostrils daily. Patient not taking: Reported on 11/17/2016 07/04/16   Audry Pili, PA-C  omeprazole (PRILOSEC) 20 MG capsule Take 1 capsule (20 mg total) by mouth daily. 11/17/16   Antony Madura, PA-C  promethazine (PHENERGAN) 25 MG tablet Take 1 tablet (25 mg total) by mouth every 6 (six) hours as needed for nausea or vomiting. 11/17/16   Antony Madura, PA-C    Allergies Sulfa antibiotics  History reviewed. No pertinent family history.  Social History Social History   Tobacco Use   Smoking status: Current Every Day Smoker    Types: Cigarettes   Smokeless tobacco: Never Used  Building services engineer Use: Never used  Substance Use Topics   Alcohol use: No   Drug use: Yes    Types: Marijuana    Comment: OCC    Review of Systems  Constitutional: No fever/chills Eyes: No visual changes. ENT: No sore throat. Cardiovascular: Denies chest pain. Respiratory: Denies shortness of breath. Gastrointestinal: Positive for abdominal pain.  No nausea, no vomiting.  Positive for diarrhea.  No constipation. Genitourinary: Negative for dysuria. Musculoskeletal: Negative for back pain. Skin: Negative for rash. Neurological: Negative for headaches, focal weakness or numbness. ___________________________________________   PHYSICAL EXAM:  VITAL SIGNS: ED Triage Vitals  Enc Vitals Group     BP 12/11/19 1747 115/71     Pulse  Rate 12/11/19 1747 87     Resp 12/11/19 1747 16     Temp 12/11/19 1747 99.1 F (37.3 C)     Temp Source 12/11/19 1747 Oral     SpO2 12/11/19 1747 99 %     Weight 12/11/19 1745 125 lb (56.7 kg)     Height 12/11/19 1745 5\' 10"  (1.778 m)     Head Circumference --      Peak Flow --      Pain Score 12/11/19 1744 5     Pain Loc --      Pain Edu? --      Excl. in GC? --     Constitutional: Alert and oriented.  Uncomfortable appearing and in no acute distress. Eyes: Conjunctivae are normal.  Head:  Atraumatic. Nose: No congestion/rhinnorhea. Mouth/Throat: Mucous membranes are moist.  Oropharynx non-erythematous. Neck: No stridor.   Hematological/Lymphatic/Immunilogical: No cervical lymphadenopathy. Cardiovascular: Normal rate, regular rhythm. Grossly normal heart sounds.  Good peripheral circulation. Respiratory: Normal respiratory effort.  No retractions. Lungs CTAB. Gastrointestinal: Soft and tender over the right upper quadrant. No distention. No abdominal bruits.  Genitourinary:  Musculoskeletal: No lower extremity tenderness nor edema.  No joint effusions. Neurologic:  Normal speech and language. No gross focal neurologic deficits are appreciated. No gait instability. Skin:  Skin is warm, dry and intact. No rash noted. Psychiatric: Mood and affect are normal. Speech and behavior are normal.  ____________________________________________   LABS (all labs ordered are listed, but only abnormal results are displayed)  Labs Reviewed  COMPREHENSIVE METABOLIC PANEL - Abnormal; Notable for the following components:      Result Value   Glucose, Bld 107 (*)    BUN 21 (*)    Albumin 5.1 (*)    All other components within normal limits  CBC - Abnormal; Notable for the following components:   HCT 37.8 (*)    All other components within normal limits  URINALYSIS, COMPLETE (UACMP) WITH MICROSCOPIC - Abnormal; Notable for the following components:   Color, Urine YELLOW (*)    APPearance HAZY (*)    All other components within normal limits  LIPASE, BLOOD   ____________________________________________  EKG  Not indicated ____________________________________________  RADIOLOGY  ED MD interpretation:    Ultrasound image of the right upper quadrant shows no acute findings per radiology.  I, 12/13/19, personally viewed and evaluated these images (plain radiographs) as part of my medical decision making, as well as reviewing the written report by the radiologist.  Official  radiology report(s): [place] Abdomen Limited RUQ (LIVER/GB)  Result Date: 12/11/2019 CLINICAL DATA:  Right upper quadrant pain EXAM: ULTRASOUND ABDOMEN LIMITED RIGHT UPPER QUADRANT COMPARISON:  None. FINDINGS: Gallbladder: No gallstones or wall thickening visualized. No sonographic Murphy sign noted by sonographer. Common bile duct: Diameter: Normal caliber, 1 mm Liver: No focal lesion identified. Within normal limits in parenchymal echogenicity. Portal vein is patent on color Doppler imaging with normal direction of blood flow towards the liver. Other: None. IMPRESSION: Normal right upper quadrant ultrasound. Electronically Signed   By: 12/13/2019 M.D.   On: 12/11/2019 20:48    ____________________________________________   PROCEDURES  Procedure(s) performed (including Critical Care):  Procedures  ____________________________________________   INITIAL IMPRESSION / ASSESSMENT AND PLAN     29 year old male presents to the emergency department for treatment and evaluation of abdominal pain.  See HPI for further details.  While awaiting ER room assignment, labs were drawn and urinalysis was obtained.  Plan will be to review  labs and order  DIFFERENTIAL DIAGNOSIS  Acute cholecystitis, IBS, diverticulitis, diverticulosis  ED COURSE  Lab studies as per overall reassuring.  Normal white count 6.0, CMP shows a BUN of 21 but a normal creatinine 1.86 and a slightly elevated albumin level at 5.1.  Lipase is normal at 28.  Urinalysis is unremarkable.  Awaiting right upper quadrant ultrasound.  ----------------------------------------- 8:53 PM on 12/11/2019 -----------------------------------------  Ultrasound is reassuring. He will be treated with PPI and given referral to GI. He is to call and schedule an appointment. He is to return to the ER for symptoms that change or worsen if unable to schedule an appointment.    ___________________________________________   FINAL CLINICAL  IMPRESSION(S) / ED DIAGNOSES  Final diagnoses:  RUQ pain  Pain of upper abdomen     ED Discharge Orders         Ordered    famotidine (PEPCID) 20 MG tablet  2 times daily        12/11/19 2056           DARRIK RICHMAN was evaluated in Emergency Department on 12/12/2019 for the symptoms described in the history of present illness. He was evaluated in the context of the global COVID-19 pandemic, which necessitated consideration that the patient might be at risk for infection with the SARS-CoV-2 virus that causes COVID-19. Institutional protocols and algorithms that pertain to the evaluation of patients at risk for COVID-19 are in a state of rapid change based on information released by regulatory bodies including the CDC and federal and state organizations. These policies and algorithms were followed during the patient's care in the ED.   Note:  This document was prepared using Dragon voice recognition software and may include unintentional dictation errors.   Chinita Pester, FNP 12/12/19 1214    Dionne Bucy, MD 12/12/19 1525

## 2021-08-27 IMAGING — US US ABDOMEN LIMITED
1 series · 14 of 25 positions shown · non-contrast
Comparison: None.

CLINICAL DATA: Right upper quadrant pain

EXAM:
ULTRASOUND ABDOMEN LIMITED RIGHT UPPER QUADRANT

[Series 1: us abdomen limited ruq (liver/gb) · 14 of 54 slices shown]
[im 1/54]
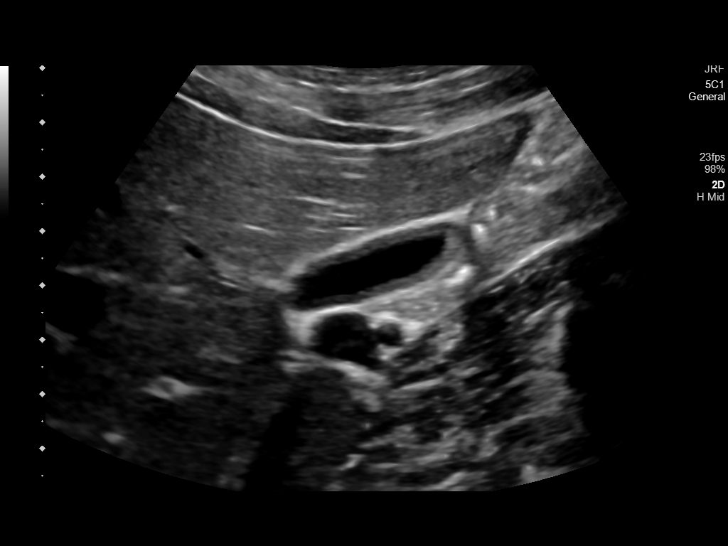
[im 5/54]
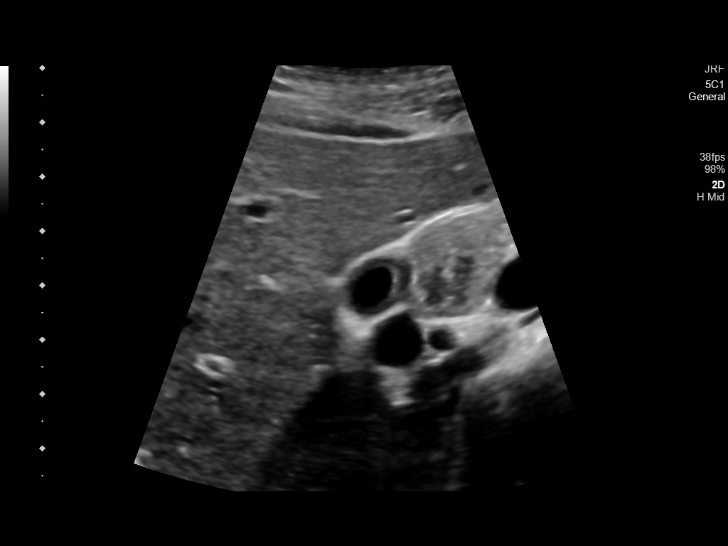
[im 9/54]
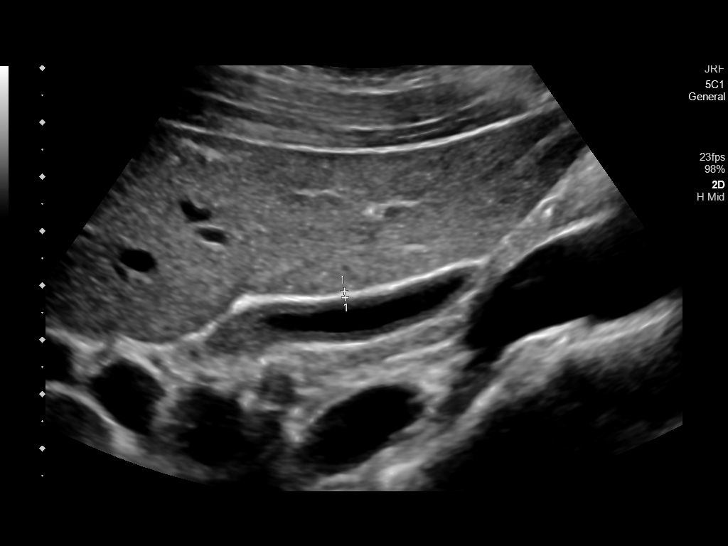
[im 14/54]
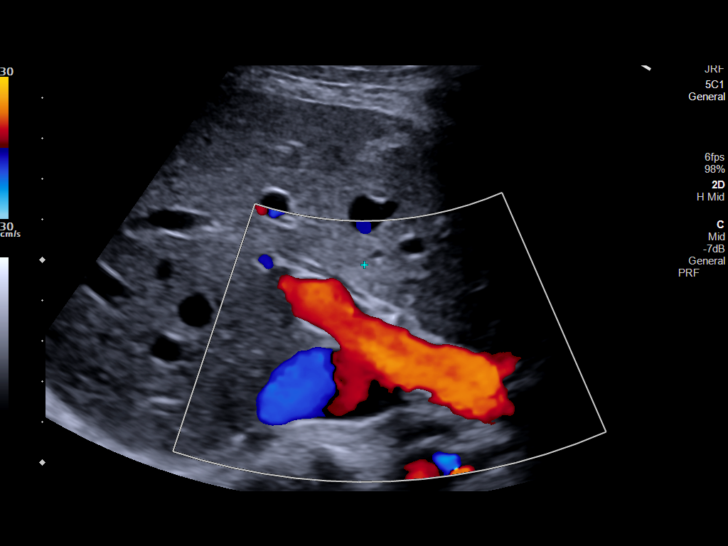
[im 18/54]
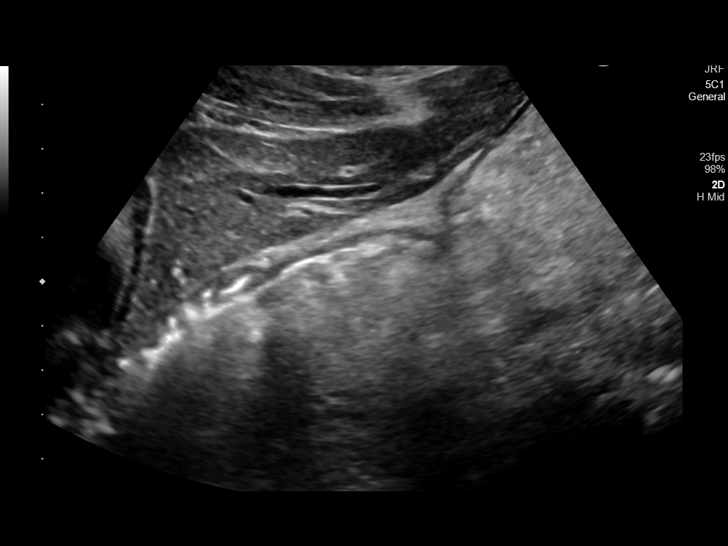
[im 20/54]
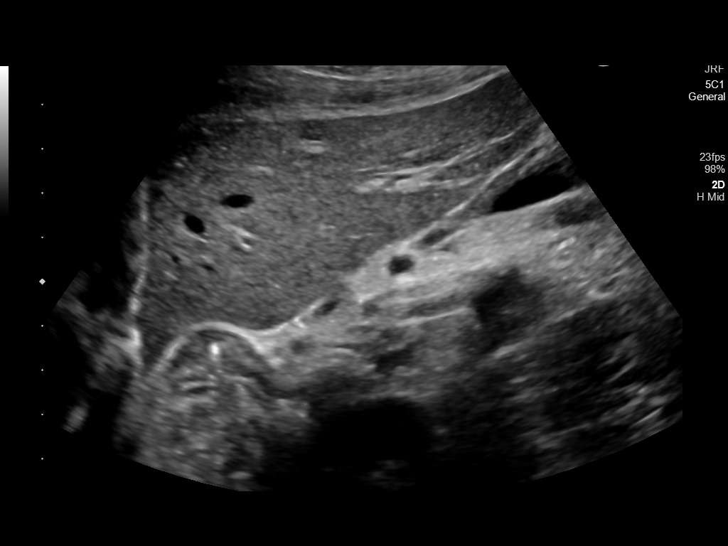
[im 25/54]
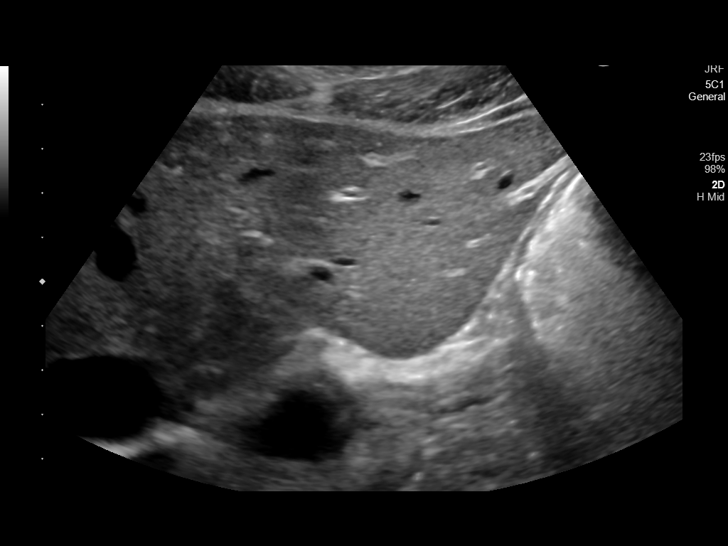
[im 29/54]
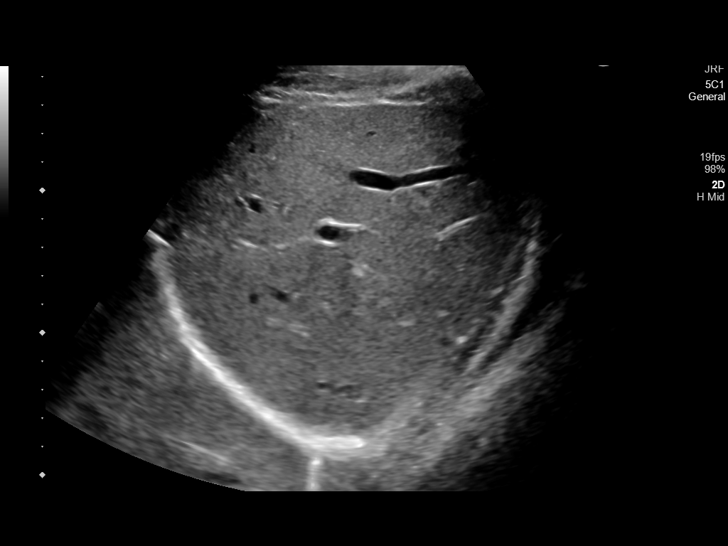
[im 34/54]
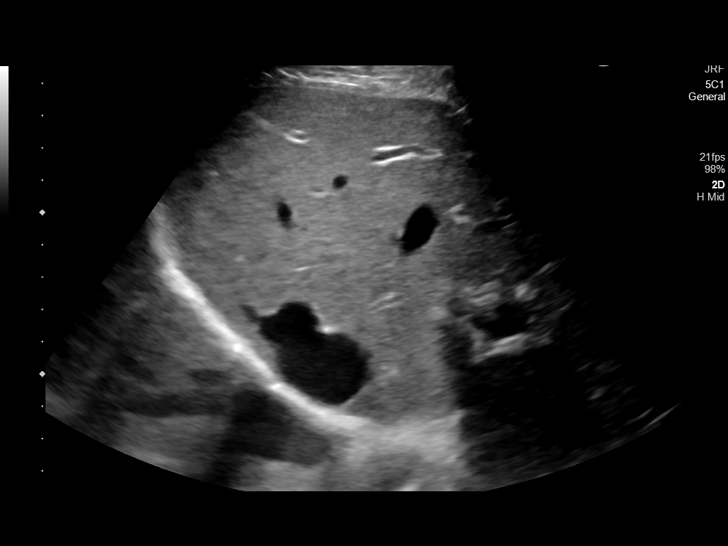
[im 36/54]
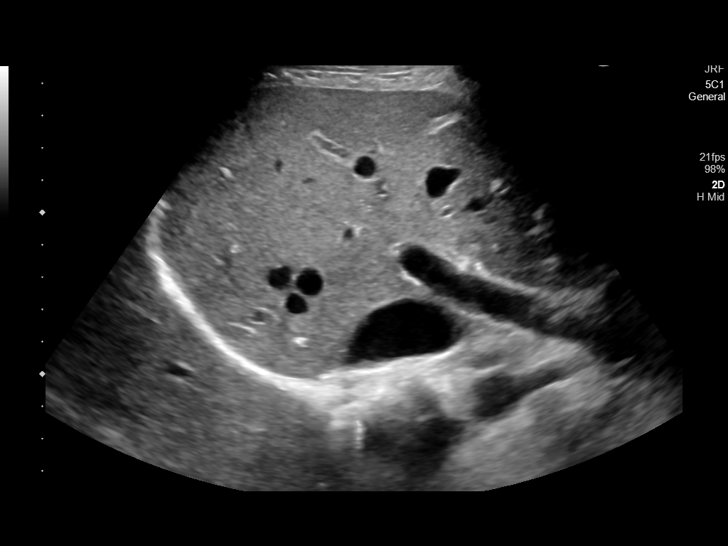
[im 40/54]
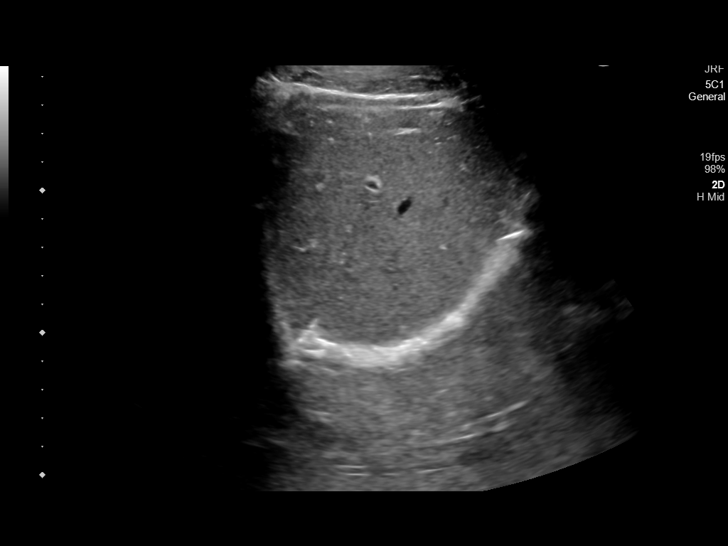
[im 45/54]
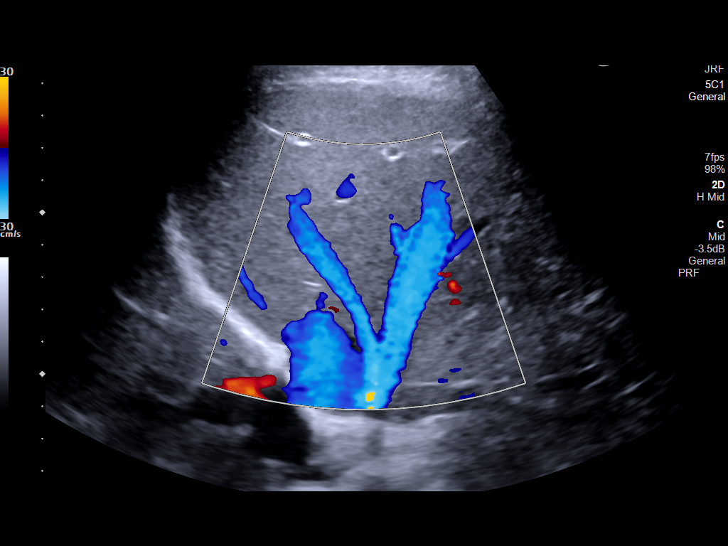
[im 49/54]
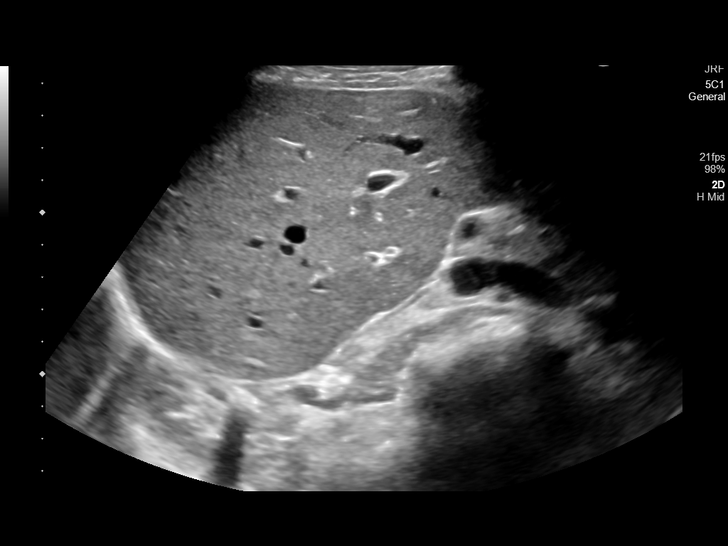
[im 54/54]
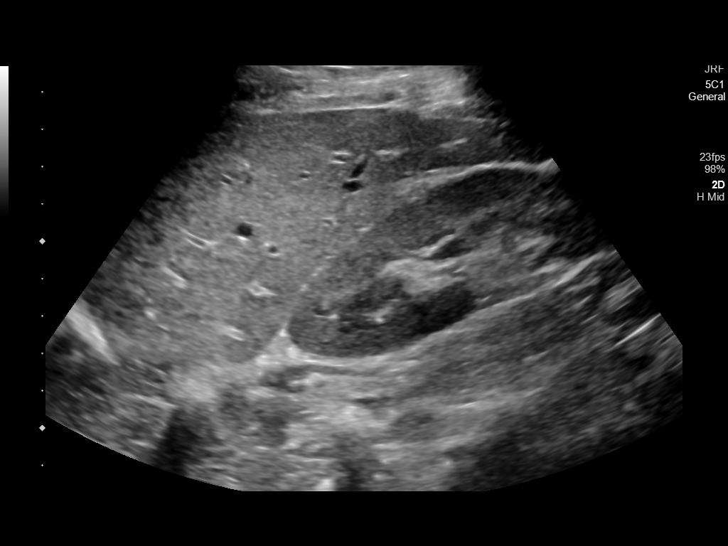

[14 of 25 positions shown; findings below may reference images not displayed]

FINDINGS: Gallbladder:

No gallstones or wall thickening visualized. No sonographic Murphy
sign noted by sonographer.

Common bile duct:

Diameter: Normal caliber, 1 mm

Liver:

No focal lesion identified. Within normal limits in parenchymal
echogenicity. Portal vein is patent on color Doppler imaging with
normal direction of blood flow towards the liver.

Other: None.
IMPRESSION: Normal right upper quadrant ultrasound.
